# Patient Record
Sex: Male | Born: 1945
Health system: Southern US, Community
[De-identification: ages and names within clinical notes are randomized; demographics above are authoritative.]

## PROBLEM LIST (undated history)

## (undated) DIAGNOSIS — N289 Disorder of kidney and ureter, unspecified: Secondary | ICD-10-CM

## (undated) DIAGNOSIS — C801 Malignant (primary) neoplasm, unspecified: Secondary | ICD-10-CM

## (undated) DIAGNOSIS — E785 Hyperlipidemia, unspecified: Secondary | ICD-10-CM

## (undated) DIAGNOSIS — R7401 Elevation of levels of liver transaminase levels: Secondary | ICD-10-CM

## (undated) DIAGNOSIS — M109 Gout, unspecified: Secondary | ICD-10-CM

## (undated) DIAGNOSIS — R740 Nonspecific elevation of levels of transaminase and lactic acid dehydrogenase [LDH]: Secondary | ICD-10-CM

## (undated) DIAGNOSIS — I10 Essential (primary) hypertension: Secondary | ICD-10-CM

## (undated) HISTORY — PX: OTHER SURGICAL HISTORY: SHX169

## (undated) HISTORY — PX: APPENDECTOMY: SHX54

## (undated) HISTORY — PX: HAND SURGERY: SHX662

## (undated) HISTORY — PX: COLON RESECTION: SHX5231

## (undated) HISTORY — DX: Elevation of levels of liver transaminase levels: R74.01

## (undated) HISTORY — DX: Disorder of kidney and ureter, unspecified: N28.9

## (undated) HISTORY — DX: Nonspecific elevation of levels of transaminase and lactic acid dehydrogenase (ldh): R74.0

---

## 2001-09-24 ENCOUNTER — Emergency Department (HOSPITAL_COMMUNITY): Admission: EM | Admit: 2001-09-24 | Discharge: 2001-09-24 | Payer: Self-pay | Admitting: Emergency Medicine

## 2001-09-24 ENCOUNTER — Encounter: Payer: Self-pay | Admitting: Emergency Medicine

## 2003-12-06 ENCOUNTER — Ambulatory Visit (HOSPITAL_COMMUNITY): Admission: RE | Admit: 2003-12-06 | Discharge: 2003-12-06 | Payer: Self-pay | Admitting: Family Medicine

## 2004-09-04 ENCOUNTER — Ambulatory Visit (HOSPITAL_COMMUNITY): Admission: RE | Admit: 2004-09-04 | Discharge: 2004-09-04 | Payer: Self-pay | Admitting: Family Medicine

## 2004-11-09 ENCOUNTER — Emergency Department (HOSPITAL_COMMUNITY): Admission: EM | Admit: 2004-11-09 | Discharge: 2004-11-09 | Payer: Self-pay | Admitting: Emergency Medicine

## 2005-07-16 ENCOUNTER — Encounter (INDEPENDENT_AMBULATORY_CARE_PROVIDER_SITE_OTHER): Payer: Self-pay | Admitting: General Surgery

## 2005-07-16 ENCOUNTER — Ambulatory Visit (HOSPITAL_COMMUNITY): Admission: RE | Admit: 2005-07-16 | Discharge: 2005-07-16 | Payer: Self-pay | Admitting: General Surgery

## 2005-07-23 ENCOUNTER — Inpatient Hospital Stay (HOSPITAL_COMMUNITY): Admission: RE | Admit: 2005-07-23 | Discharge: 2005-07-27 | Payer: Self-pay | Admitting: General Surgery

## 2005-07-23 ENCOUNTER — Encounter (INDEPENDENT_AMBULATORY_CARE_PROVIDER_SITE_OTHER): Payer: Self-pay | Admitting: General Surgery

## 2010-06-20 ENCOUNTER — Ambulatory Visit (HOSPITAL_COMMUNITY): Admission: RE | Admit: 2010-06-20 | Discharge: 2010-06-20 | Payer: Self-pay | Admitting: Internal Medicine

## 2010-09-20 ENCOUNTER — Ambulatory Visit: Payer: Self-pay | Admitting: Cardiovascular Disease

## 2010-10-23 ENCOUNTER — Encounter (INDEPENDENT_AMBULATORY_CARE_PROVIDER_SITE_OTHER): Payer: Self-pay | Admitting: *Deleted

## 2010-10-24 ENCOUNTER — Encounter: Payer: Self-pay | Admitting: Cardiology

## 2010-11-09 NOTE — Letter (Signed)
Summary: [facility] MEDICAL RECORDS  [facility]   Imported By: Faythe Ghee 10/24/2010 09:36:22  _____________________________________________________________________  External Attachment:    Type:   Image     Comment:   External Document

## 2010-11-09 NOTE — Letter (Signed)
Summary: Appointment - Missed  [facility] [place]  [address], [place] [address]   Phone: [phone]  Fax: [phone]     October 23, 2010 MRN: [id number]   Jimmy Ayala [address] [place], [place]  [address]   Dear [name],  Our records indicate you missed your appointment on      10/23/10                  with Dr.       .      MCDOWELL                              It is very important that we reach you to reschedule this appointment. We look forward to participating in your health care needs. Please contact us at the number listed above at your earliest convenience to reschedule this appointment.     Sincerely,    Glass blower/designer

## 2011-02-23 NOTE — H&P (Signed)
NAME:  Jimmy Ayala, Jimmy Ayala NO.:  [id number]   MEDICAL RECORD NO.:  [id number]          PATIENT TYPE:  AMB   LOCATION:  DAY                           FACILITY:  [facility]   PHYSICIAN:  Dalia Heading, M.D.  DATE OF BIRTH:  31-Jul-1946   DATE OF ADMISSION:  07/18/2005  DATE OF DISCHARGE:  LH                                HISTORY & PHYSICAL   CHIEF COMPLAINT:  Sigmoid colon mass.   HISTORY OF PRESENT ILLNESS:  Patient is a 65 year old white male who  underwent a colonoscopy and was found to have a sessile mass in the sigmoid  colon.  It was approximately 35-cm from the anus.  Biopsies revealed a  villus adenoma, though there was an abnormal invasive component to this.  He  has a strong family history of colon carcinoma.  His father and many  relatives on his paternal side have a history of colon cancer.  The mass  could not be removed endoscopically.  The patient now comes to the operating  room for a partial colectomy.   PAST MEDICAL HISTORY:  Includes hypertension.   PAST SURGICAL HISTORY:  Unremarkable.   CURRENT MEDICATIONS:  1.  Vytorin 1 tablet p.o. daily  2.  Benazepril/hydrochlorothiazide 10/12.5 mg p.o. daily.   ALLERGIES:  No known drug allergies.   REVIEW OF SYSTEMS:  Patient denies drinking or smoking.  He denies any other  cardiopulmonary difficulties or bleeding disorders.   PHYSICAL EXAMINATION:  GENERAL:  Patient is a well-developed, well-nourished  white male in no acute distress.  LUNGS:  Clear to auscultation with equal breath sounds bilaterally.  HEART:  Examination reveals a regular rate and rhythm without S3, S4 or  murmurs.  ABDOMEN:  Soft, nontender and nondistended, no hepatosplenomegaly or masses  are noted.   IMPRESSION:  Sigmoid colon mass, possible malignant neoplasm.   PLAN:  The patient is scheduled to undergo a partial colectomy on July 23, 2005.  The risks and benefits of the procedure including bleeding,  infection and the  possibility of malignancy were fully explained to the  patient who gave informed consent.      Dalia Heading, M.D.  Electronically Signed     MAJ/MEDQ  D:  07/18/2005  T:  07/18/2005  Job:  [id number]   cc:   Corrie Mckusick, M.D.  Fax: [phone]   Short Stay  [facility]

## 2011-02-23 NOTE — H&P (Signed)
NAME:  Jimmy Ayala, SCHNOOR NO.:  [id number]   MEDICAL RECORD NO.:  [id number]          PATIENT TYPE:  AMB   LOCATION:                                FACILITY:  [facility]   PHYSICIAN:  Dalia Heading, M.D.  DATE OF BIRTH:  [birth year]   DATE OF ADMISSION:  DATE OF DISCHARGE:  LH                                HISTORY & PHYSICAL   CHIEF COMPLAINT:  Family history of colon carcinoma, need for screening  colonoscopy.   HISTORY OF PRESENT ILLNESS:  The patient is a 65 year old white male who is  referred for endoscopic evaluation. He needs a colonoscopy for screening due  to a family history of colon carcinoma. His father and many relatives on his  paternal side have a history of colon carcinoma. There is no abdominal pain,  weight loss, nausea, vomiting, diarrhea, constipation, melena, or  hematochezia. He has never had a colonoscopy.   PAST MEDICAL HISTORY:  Hypertension.   PAST SURGICAL HISTORY:  Unremarkable.   CURRENT MEDICATIONS:  Vytorin and benazepril/hydrochlorothiazide.   ALLERGIES:  No known drug allergies.   REVIEW OF SYSTEMS:  The patient does not smoke. He drinks alcohol  occasionally.   PHYSICAL EXAMINATION:  GENERAL:  The patient is a well-developed, well-  nourished, white male in no acute distress.  LUNGS:  Clear to auscultation with equal breath sounds bilaterally.  HEART:  Reveals regular rate and rhythm without S3, S4, or murmurs.  ABDOMEN:  Soft, nontender, nondistended. No hepatosplenomegaly or masses are  noted.  RECTAL:  Deferred to the procedure.   IMPRESSION:  Family history of colon carcinoma.   PLAN:  The patient is scheduled for colonoscopy on July 16, 2005. The  risks and benefits of the procedure including bleeding and perforation were  fully explained to the patient who gave informed consent.      Dalia Heading, M.D.  Electronically Signed     MAJ/MEDQ  D:  07/05/2005  T:  07/05/2005  Job:  [id number]   cc:   Corrie Mckusick, M.D.  Fax: [phone]   [facility]  Fax: [phone]

## 2011-02-23 NOTE — Op Note (Signed)
NAME:  Jimmy Ayala, Jimmy Ayala NO.:  [id number]   MEDICAL RECORD NO.:  [id number]          PATIENT TYPE:  AMB   LOCATION:  DAY                           FACILITY:  [facility]   PHYSICIAN:  Dalia Heading, M.D.  DATE OF BIRTH:  [birth year]   DATE OF PROCEDURE:  DATE OF DISCHARGE:                                 OPERATIVE REPORT   PREOPERATIVE DIAGNOSIS:  Sigmoid colon neoplasm.   POSTOPERATIVE DIAGNOSIS:  Sigmoid colon neoplasm.   PROCEDURE:  Partial colectomy with splenic flexure takedown.   SURGEON:  Dr. Franky Macho.   ASSISTANT:  Dr. Arna Snipe.   ANESTHESIA:  General endotracheal.   INDICATIONS:  The patient is a 65 year old white male who was found on  colonoscopy to have a sigmoid colon neoplasm. Biopsies initially were  negative for malignancy, though there was a desmoplastic reaction. The  patient has a strong family history of colon carcinoma. The patient now  comes the operating for partial colectomy. Risks and benefits of the  procedure including bleeding, infection, cardiopulmonary difficulties,  possibility of a blood transfusion were fully explained to the patient, who  gave informed consent.   PROCEDURE NOTE:  The patient is placed in supine position. After induction  of general endotracheal anesthesia, the abdomen was prepped and draped in  the usual sterile technique with Betadine. Surgical site confirmation was  performed.   A midline incision was made from the umbilicus to the suprapubic region. The  peritoneal cavity was entered into without difficulty. The liver and  gallbladder were inspected and noted to be normal limits. The small  intestine was noted be within normal limits. The nasogastric tube was noted  be in appropriate position in the stomach. The colon was inspected and the  patient had a mass in the mid sigmoid colon region. The descending colon,  sigmoid colon regions were mobilized along the line of Toldt. The splenic  flexure  was mobilized in order to obtain more descending colon. A GIA  stapler was placed across the proximal and distal portions of the sigmoid  colon. Care was taken to avoid the ureter. The sigmoid colon mesenteric tear  was then divided using the harmonic scalpel.  A suture was placed proximally  on the colon. The specimen was removed without difficulty. The specimen was  inspected in the operating room and adequate margins were noted proximally  and distally from the mass.  This was then sent to pathology for further  examination. A side-to-side anastomosis was then performed using a GIA  stapler. The staple line was closed using a TA60 stapler. The staple line  was bolstered using 3-0 silk Lembert sutures.  Surrounding adipose tissue  was then placed over the anastomosis. No tension was noted on the  anastomosis. The abdominal cavity was then copiously irrigated with normal  saline. All surgical personnel then changed their gloves.   The bowel was returned into the abdominal cavity in orderly fashion. The  fascia was reapproximated using a looped 0 Novofil running suture.  Subcutaneous layer was irrigated with normal saline and skin was closed  using staples. Betadine ointment  dry sterile dressings were applied.   All tape and needle counts correct the end of procedure. The patient was  extubated in the operating room and went back to recovery room awake in  stable condition.   COMPLICATIONS:  None.   SPECIMEN:  Sigmoid colon.   BLOOD LOSS:  Minimal.      Dalia Heading, M.D.  Electronically Signed     MAJ/MEDQ  D:  07/23/2005  T:  07/23/2005  Job:  [id number]   cc:   Corrie Mckusick, M.D.  Fax: [phone]

## 2011-02-23 NOTE — Discharge Summary (Signed)
NAME:  Jimmy Ayala, Jimmy Ayala NO.:  [id number]   MEDICAL RECORD NO.:  [id number]          PATIENT TYPE:  INP   LOCATION:  A331                          FACILITY:  [facility]   PHYSICIAN:  Dalia Heading, M.D.  DATE OF BIRTH:  [birth year]   DATE OF ADMISSION:  07/23/2005  DATE OF DISCHARGE:  10/20/2006LH                                 DISCHARGE SUMMARY   HOSPITAL COURSE:  The patient is a 65 year old, white male who was found on  colonoscopy to have a sigmoid colon mass.  Initial biopsies were suspicious  for malignancy, though no malignancy was seen.  The patient was brought to  the operating room on July 23, 2005, and underwent a partial colectomy.  He tolerated the procedure well.  His diet was advanced without difficulty.  His CEA level preoperatively was 1.  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  No lymph  nodes were identified at the time and the final pathology results are  pending.   CONDITION ON DISCHARGE:  The patient is being discharged home on postop day  #4 in good and improving condition.   FOLLOW UP:  The patient is to follow up with Dr. Franky Macho on July 31, 2005.   DISCHARGE MEDICATIONS:  1.  Vicodin 1-2 tablets p.o. q.4h. p.r.n. pain.  2.  He is to resume all his other medications as previously prescribed.   DISCHARGE DIAGNOSES:  1.  Sigmoid colon carcinoma.  2.  History of gout.  3.  Hypertension.   PRINCIPAL PROCEDURE:  Sigmoid colectomy on July 23, 2005.      Dalia Heading, M.D.  Electronically Signed     MAJ/MEDQ  D:  07/27/2005  T:  07/27/2005  Job:  [id number]   cc:   Corrie Mckusick, M.D.  Fax: [phone]

## 2012-05-22 NOTE — H&P (Signed)
  NTS SOAP Note  Vital Signs:  Vitals as of: 05/22/2012: Systolic 154: Diastolic 94: Heart Rate 93: Temp 96.59F: Height 20ft 8in: Weight 202Lbs 0 Ounces: BMI 31  BMI : 30.71 kg/m2  Subjective: This 3 Years 72 Months old Male presents for follow TCS.  Had partial colectomy for colon cancer 2006.  Has not had a follow TCS.  Denies any GI complaints.   Review of Symptoms:  Constitutional:unremarkable   Head:unremarkable    Eyes:unremarkable   Nose/Mouth/Throat:unremarkable Cardiovascular:  unremarkable   Respiratory:unremarkable   Gastrointestinal:  unremarkable   Genitourinary:unremarkable       joint, back, neck pain Skin:unremarkable Hematolgic/Lymphatic:unremarkable     Allergic/Immunologic:unremarkable     Past Medical History:    Reviewed   Past Medical History  Surgical History: sigmoid colectomy in 2006 Medical Problems:  High Blood pressure, High cholesterol, gout Allergies: nkda Medications: lisinopril, atorvastatin, amlodipine, allopurinol   Social History:Reviewed  Social History  Preferred Language: English ([place]) Race:  White Ethnicity: Not Hispanic / Latino Age: 66 Years 6 Months Marital Status:  M Alcohol: 8oz dily Recreational drug(s):  No   Smoking Status: Never smoker reviewed on 05/22/2012  Family History:  Reviewed   Family History  Is there a family history of:No family h/o colon cancer    Objective Information: General:  Well appearing, well nourished in no distress. Heart:  RRR, no murmur or gallop.  Normal S1, S2.  No S3, S4.  Lungs:    CTA bilaterally, no wheezes, rhonchi, rales.  Breathing unlabored. Abdomen:Soft, NT/ND, no HSM, no masses.   deferred to procedure  Assessment:Personal h/o colon cancer  Diagnosis &amp; Procedure: DiagnosisCode: V10.00, ProcedureCode: 04540,    Plan:Scheduled for TCS on 06/03/12.

## 2012-06-02 MED ORDER — SODIUM CHLORIDE 0.45 % IV SOLN
INTRAVENOUS | Status: DC
  Administered 2012-06-03: 09:00:00 via INTRAVENOUS

## 2012-06-02 MED ORDER — SODIUM CHLORIDE 0.9 % IV SOLN: INTRAVENOUS | Status: DC

## 2012-06-03 ENCOUNTER — Encounter (HOSPITAL_COMMUNITY)
Admission: RE | Disposition: A | Discharge status: Home / Self Care | Payer: Self-pay | Source: Ambulatory Visit | Attending: General Surgery

## 2012-06-03 ENCOUNTER — Encounter (HOSPITAL_COMMUNITY): Payer: Self-pay | Admitting: *Deleted

## 2012-06-03 ENCOUNTER — Ambulatory Visit (HOSPITAL_COMMUNITY)
Admission: RE | Admit: 2012-06-03 | Discharge: 2012-06-03 | Disposition: A | Discharge status: Home / Self Care | Payer: Medicare Other | Source: Ambulatory Visit | Attending: General Surgery | Admitting: General Surgery

## 2012-06-03 DIAGNOSIS — Z1211 Encounter for screening for malignant neoplasm of colon: Secondary | ICD-10-CM | POA: Insufficient documentation

## 2012-06-03 DIAGNOSIS — E7800 Pure hypercholesterolemia, unspecified: Secondary | ICD-10-CM | POA: Insufficient documentation

## 2012-06-03 DIAGNOSIS — K5730 Diverticulosis of large intestine without perforation or abscess without bleeding: Secondary | ICD-10-CM | POA: Insufficient documentation

## 2012-06-03 DIAGNOSIS — I10 Essential (primary) hypertension: Secondary | ICD-10-CM | POA: Insufficient documentation

## 2012-06-03 HISTORY — DX: Malignant (primary) neoplasm, unspecified: C80.1

## 2012-06-03 HISTORY — DX: Essential (primary) hypertension: I10

## 2012-06-03 HISTORY — DX: Gout, unspecified: M10.9

## 2012-06-03 HISTORY — DX: Hyperlipidemia, unspecified: E78.5

## 2012-06-03 HISTORY — PX: COLONOSCOPY: SHX5424

## 2012-06-03 SURGERY — COLONOSCOPY
Anesthesia: Moderate Sedation

## 2012-06-03 MED ORDER — MEPERIDINE HCL 25 MG/ML IJ SOLN
INTRAMUSCULAR | Status: DC | PRN
  Administered 2012-06-03: 50 mg via INTRAVENOUS

## 2012-06-03 MED ORDER — MIDAZOLAM HCL 5 MG/5ML IJ SOLN
INTRAMUSCULAR | Status: AC
  Filled 2012-06-03: qty 5

## 2012-06-03 MED ORDER — MIDAZOLAM HCL 5 MG/5ML IJ SOLN
INTRAMUSCULAR | Status: DC | PRN
  Administered 2012-06-03: 4 mg via INTRAVENOUS

## 2012-06-03 MED ORDER — MEPERIDINE HCL 50 MG/ML IJ SOLN
INTRAMUSCULAR | Status: AC
  Filled 2012-06-03: qty 1

## 2012-06-03 MED ORDER — STERILE WATER FOR IRRIGATION IR SOLN
Status: DC | PRN
  Administered 2012-06-03: 09:00:00

## 2012-06-03 NOTE — Interval H&P Note (Signed)
History and Physical Interval Note:  06/03/2012 9:12 AM  Jimmy Ayala  has presented today for surgery, with the diagnosis of History of colon cancer  The various methods of treatment have been discussed with the patient and family. After consideration of risks, benefits and other options for treatment, the patient has consented to  Procedure(s) (LRB): COLONOSCOPY (N/A) as a surgical intervention .  The patient's history has been reviewed, patient examined, no change in status, stable for surgery.  I have reviewed the patient's chart and labs.  Questions were answered to the patient's satisfaction.     [name]

## 2012-06-03 NOTE — Op Note (Signed)
[facility] [address] [place], [address]   COLONOSCOPY PROCEDURE REPORT  PATIENT: Jimmy Ayala, Jimmy Ayala  MR#: [id number] BIRTHDATE: 1946/09/01 , 66  yrs. old GENDER: Male ENDOSCOPIST: Franky Macho, MD REFERRED WN:[id number], William PROCEDURE DATE:  06/03/2012 PROCEDURE:   Colonoscopy, surveillance ASA CLASS:   Class II INDICATIONS:follow up for previously diagnosed colon cancer. MEDICATIONS: Versed 4 mg IV and Demerol 50 mg IV  DESCRIPTION OF PROCEDURE:   After the risks benefits and alternatives of the procedure were thoroughly explained, informed consent was obtained.  A digital rectal exam revealed no abnormalities of the rectum.   The Pentax Colonoscope [id number] endoscope was introduced through the anus and advanced to the cecum, which was identified by both the appendix and ileocecal valve. No adverse events experienced.   The quality of the prep was adequate, using Trilyte  The instrument was then slowly withdrawn as the colon was fully examined.      COLON FINDINGS: Moderate diverticulosis was noted in the descending colon.   There was no evidence of colon cancer.  Anastomosis on left side widely patent.  Retroflexed views revealed no abnormalities. The time to cecum=5 minutes 0 seconds  Withdrawal time=3 minutes 0 seconds.  The scope was withdrawn and the procedure completed. COMPLICATIONS: There were no complications. ENDOSCOPIC IMPRESSION: 1.   Moderate diverticulosis was noted in the descending colon 2.   There was no evidence of colon cancer.  Anastomosis on left side widely patent  RECOMMENDATIONS: repeat Colonoscopy in 5 years.  eSigned:  Franky Macho, MD 06/03/2012 9:37 AM   cc:

## 2012-06-05 ENCOUNTER — Encounter (HOSPITAL_COMMUNITY): Payer: Self-pay | Admitting: General Surgery

## 2012-12-22 ENCOUNTER — Ambulatory Visit (HOSPITAL_COMMUNITY)
Admission: RE | Admit: 2012-12-22 | Discharge: 2012-12-22 | Disposition: A | Discharge status: Home / Self Care | Payer: Medicare Other | Source: Ambulatory Visit | Attending: Family Medicine | Admitting: Family Medicine

## 2012-12-22 ENCOUNTER — Other Ambulatory Visit (HOSPITAL_COMMUNITY): Payer: Self-pay | Admitting: Family Medicine

## 2012-12-22 DIAGNOSIS — M542 Cervicalgia: Secondary | ICD-10-CM | POA: Insufficient documentation

## 2012-12-22 DIAGNOSIS — M5030 Other cervical disc degeneration, unspecified cervical region: Secondary | ICD-10-CM | POA: Insufficient documentation

## 2012-12-29 ENCOUNTER — Emergency Department (HOSPITAL_COMMUNITY): Payer: Medicare Other

## 2012-12-29 ENCOUNTER — Encounter (HOSPITAL_COMMUNITY): Payer: Self-pay | Admitting: *Deleted

## 2012-12-29 ENCOUNTER — Emergency Department (HOSPITAL_COMMUNITY)
Admission: EM | Admit: 2012-12-29 | Discharge: 2012-12-29 | Disposition: A | Discharge status: Home / Self Care | Payer: Medicare Other | Attending: Emergency Medicine | Admitting: Emergency Medicine

## 2012-12-29 DIAGNOSIS — R55 Syncope and collapse: Secondary | ICD-10-CM | POA: Insufficient documentation

## 2012-12-29 DIAGNOSIS — I10 Essential (primary) hypertension: Secondary | ICD-10-CM | POA: Insufficient documentation

## 2012-12-29 DIAGNOSIS — R42 Dizziness and giddiness: Secondary | ICD-10-CM | POA: Insufficient documentation

## 2012-12-29 DIAGNOSIS — R259 Unspecified abnormal involuntary movements: Secondary | ICD-10-CM | POA: Insufficient documentation

## 2012-12-29 DIAGNOSIS — Z79899 Other long term (current) drug therapy: Secondary | ICD-10-CM | POA: Insufficient documentation

## 2012-12-29 DIAGNOSIS — M109 Gout, unspecified: Secondary | ICD-10-CM | POA: Insufficient documentation

## 2012-12-29 DIAGNOSIS — IMO0002 Reserved for concepts with insufficient information to code with codable children: Secondary | ICD-10-CM | POA: Insufficient documentation

## 2012-12-29 DIAGNOSIS — E785 Hyperlipidemia, unspecified: Secondary | ICD-10-CM | POA: Insufficient documentation

## 2012-12-29 DIAGNOSIS — Z85038 Personal history of other malignant neoplasm of large intestine: Secondary | ICD-10-CM | POA: Insufficient documentation

## 2012-12-29 DIAGNOSIS — Y9301 Activity, walking, marching and hiking: Secondary | ICD-10-CM | POA: Insufficient documentation

## 2012-12-29 DIAGNOSIS — S46912A Strain of unspecified muscle, fascia and tendon at shoulder and upper arm level, left arm, initial encounter: Secondary | ICD-10-CM

## 2012-12-29 DIAGNOSIS — Y9289 Other specified places as the place of occurrence of the external cause: Secondary | ICD-10-CM | POA: Insufficient documentation

## 2012-12-29 DIAGNOSIS — W010XXA Fall on same level from slipping, tripping and stumbling without subsequent striking against object, initial encounter: Secondary | ICD-10-CM | POA: Insufficient documentation

## 2012-12-29 DIAGNOSIS — R5381 Other malaise: Secondary | ICD-10-CM | POA: Insufficient documentation

## 2012-12-29 LAB — BASIC METABOLIC PANEL
BUN: 22 mg/dL (ref 6–23)
Calcium: 9.6 mg/dL (ref 8.4–10.5)
GFR calc non Af Amer: >90 mL/min (ref >90)
Glucose, Bld: 141 mg/dL — ABNORMAL HIGH (ref 70–99)
Sodium: 135 mEq/L (ref 135–145)

## 2012-12-29 LAB — CBC WITH DIFFERENTIAL/PLATELET
Eosinophils Absolute: 0.1 10*3/uL (ref 0.0–0.7)
Eosinophils Relative: 1 % (ref 0–5)
Lymphs Abs: 3.3 10*3/uL (ref 0.7–4.0)
MCH: 32.8 pg (ref 26.0–34.0)
MCV: 94 fL (ref 78.0–100.0)
Monocytes Relative: 8 % (ref 3–12)
Platelets: 213 10*3/uL (ref 150–400)
RBC: 4.03 MIL/uL — ABNORMAL LOW (ref 4.22–5.81)

## 2012-12-29 MED ORDER — HYDROCODONE-ACETAMINOPHEN 5-325 MG PO TABS: 2.0000 | ORAL_TABLET | ORAL | Status: DC | PRN

## 2012-12-29 NOTE — ED Notes (Addendum)
Fall at work today while walking down a hill.  States his "feet came out from underneath him."  Denies hitting head/loc.  C/o pain to left upper arm.   No obvious deformity noted in triage.

## 2012-12-29 NOTE — ED Provider Notes (Signed)
History     CSN: [id number]  Arrival date & time 12/29/12  1019   First MD Initiated Contact with Patient 12/29/12 1100      Chief Complaint  Patient presents with  . Fall    (Consider location/radiation/quality/duration/timing/severity/associated sxs/prior treatment) HPI Comments: Patient came to the ER this morning after a fall. Patient reports that he was walking down a hill which was wet and he slipped, falling onto his left side. He is complaining of left upper arm pain at arrival to the ER. He did not hit his head and there was no loss of consciousness. Patient denies neck and back pain.  During the triage process, the patient became weak, dizzy and then had a syncopal episode. Triage nurse reports that he briefly became completely unresponsive and started shaking all over. She was unable to get a blood pressure at this time. This lasted only a few seconds, patient was immediately brought back to a room on the stretcher. By the time he reached the ER room, patient is awake and alert. Vital signs are now normal. Patient continues to complain of moderate to severe pain in his left upper arm just below the shoulder. It worsens with movement.  Patient is a 67 y.o. male presenting with fall.  Fall Pertinent negatives include no headaches.    Past Medical History  Diagnosis Date  . Cancer     Colon Cancer  . Hypertension   . Hyperlipidemia   . Gout     Past Surgical History  Procedure Laterality Date  . Colon resection    . Right rotator cuff repair    . Appendectomy    . Colonoscopy  06/03/2012    Procedure: COLONOSCOPY;  Surgeon: Dalia Heading, MD;  Location: [facility];  Service: Gastroenterology;  Laterality: N/A;    Family History  Problem Relation Age of Onset  . Colon cancer Father     History  Substance Use Topics  . Smoking status: Never Smoker   . Smokeless tobacco: Not on file  . Alcohol Use: Yes     Comment: 1 pint/week      Review of Systems   Respiratory: Negative for shortness of breath.   Cardiovascular: Negative for chest pain.  Musculoskeletal:       Left arm pain  Neurological: Negative for dizziness and headaches.  All other systems reviewed and are negative.    Allergies  Lipitor and Prednisone  Home Medications   Current Outpatient Rx  Name  Route  Sig  Dispense  Refill  . allopurinol (ZYLOPRIM) 100 MG tablet   Oral   Take 100 mg by mouth daily.         Marland Kitchen aspirin 325 MG tablet   Oral   Take 325 mg by mouth daily.         Marland Kitchen atorvastatin (LIPITOR) 20 MG tablet   Oral   Take 20 mg by mouth daily.         Marland Kitchen lisinopril (PRINIVIL,ZESTRIL) 20 MG tablet   Oral   Take 20 mg by mouth daily.           There were no vitals taken for this visit.  Physical Exam  Constitutional: He is oriented to person, place, and time. He appears well-developed and well-nourished. No distress.  HENT:  Head: Normocephalic and atraumatic.  Right Ear: Hearing normal.  Nose: Nose normal.  Mouth/Throat: Oropharynx is clear and moist and mucous membranes are normal.  Eyes: Conjunctivae and EOM  are normal. Pupils are equal, round, and reactive to light.  Neck: Normal range of motion. Neck supple.  Cardiovascular: Normal rate, regular rhythm, S1 normal and S2 normal.  Exam reveals no gallop and no friction rub.   No murmur heard. Pulmonary/Chest: Effort normal and breath sounds normal. No respiratory distress. He exhibits no tenderness.  Abdominal: Soft. Normal appearance and bowel sounds are normal. There is no hepatosplenomegaly. There is no tenderness. There is no rebound, no guarding, no tenderness at McBurney's point and negative Murphy's sign. No hernia.  Musculoskeletal: Normal range of motion.       Left upper arm: He exhibits tenderness and deformity.  Neurological: He is alert and oriented to person, place, and time. He has normal strength. No cranial nerve deficit or sensory deficit. Coordination normal. GCS eye  subscore is 4. GCS verbal subscore is 5. GCS motor subscore is 6.  Skin: Skin is warm, dry and intact. No rash noted. No cyanosis.  Psychiatric: He has a normal mood and affect. His speech is normal and behavior is normal. Thought content normal.    ED Course  Procedures (including critical care time)   Date: 12/29/2012  Rate: 80  Rhythm: normal sinus rhythm  QRS Axis: normal  Intervals: normal  ST/T Wave abnormalities: normal  Conduction Disutrbances:inc RBBB  Narrative Interpretation:   Old EKG Reviewed: none available    Labs Reviewed  GLUCOSE, CAPILLARY - Abnormal; Notable for the following:    Glucose-Capillary 147 (*)    All other components within normal limits  CBC WITH DIFFERENTIAL - Abnormal; Notable for the following:    WBC 10.9 (*)    RBC 4.03 (*)    HCT 37.9 (*)    All other components within normal limits  BASIC METABOLIC PANEL - Abnormal; Notable for the following:    Glucose, Bld 141 (*)    All other components within normal limits   Ct Head Wo Contrast  12/29/2012  *RADIOLOGY REPORT*  Clinical Data: Fall today following syncopal episode.  Headache with left arm pain.  CT HEAD WITHOUT CONTRAST  Technique:  Contiguous axial images were obtained from the base of the skull through the vertex without contrast.  Comparison: None.  Findings: There is no evidence of acute intracranial hemorrhage, mass lesion, brain edema or extra-axial fluid collection.  The ventricles and subarachnoid spaces are appropriately sized for age. There is no CT evidence of acute cortical infarction.  There is a probable mucous retention cyst within the right maxillary sinus. There is mild ethmoid and sphenoid sinus mucosal thickening. The calvarium is intact.  IMPRESSION: No acute intracranial or calvarial findings.   Original Report Authenticated By: Carey Bullocks, M.D.    Dg Humerus Left  12/29/2012  *RADIOLOGY REPORT*  Clinical Data: Fall.  Humerus pain.  LEFT HUMERUS - 2+ VIEW   Comparison: None.  Findings: Two-view exam of the left humerus is under penetrated at the level of the shoulder.  Within this limitation, no fracture is evident.  No worrisome lytic or sclerotic osseous abnormality.  IMPRESSION: No evidence for left humerus fracture.   Original Report Authenticated By: Kennith Center, M.D.      Diagnosis: 1. Left shoulder injury, possible rotator cuff tear 2. Vasovagal syncope    MDM  Patient slipped while walking and fell to the ground, injuring his left shoulder. Patient was having severe pain in the proximal humerus and anterior shoulder area. Upon arrival to the ER he was placed in triage and had what seems  to have been a vasovagal episode. Patient briefly lost consciousness and the triage nurse was unable to get her blood pressure at that time. By the time he was placed on a stretcher and brought back to the room, he was awake and alert and oriented with normal vital signs. There is nothing to indicate seizure. I did perform a CT scan of his head to rule out intracranial injury from the fall causing the symptoms. Was normal. His neurologic exam is completely normal.  An x-ray of the humerus does not show any fracture. Patient is unable to abduct his left shoulder. This raises concern for possible rotator cuff tear as a cause of the patient's pain, resulting from the fall. He was placed in a sling for comfort and provided analgesia. He will followup with orthopedics.        Gilda Crease, MD 12/29/12 1300

## 2012-12-29 NOTE — ED Notes (Signed)
During triage, pt became very pale, diaphoretic, states " I feel like I'm going to be sick."  Pt's eyes rolled in back of his eyes and became very lethargic.  Syncopal episode then turned into seizure like activity with snoring respirations and unresponsiveness.  Lasting approx 30-45 secs.  Pt alert and oriented at this time.  edp at bedside.

## 2014-03-31 DIAGNOSIS — N289 Disorder of kidney and ureter, unspecified: Secondary | ICD-10-CM

## 2014-03-31 HISTORY — DX: Disorder of kidney and ureter, unspecified: N28.9

## 2014-04-13 ENCOUNTER — Ambulatory Visit: Payer: Medicare Other | Admitting: Neurology

## 2014-04-16 ENCOUNTER — Ambulatory Visit (INDEPENDENT_AMBULATORY_CARE_PROVIDER_SITE_OTHER): Payer: Medicare Other | Admitting: Neurology

## 2014-04-16 ENCOUNTER — Encounter: Payer: Self-pay | Admitting: Neurology

## 2014-04-16 VITALS — BP 140/80 | HR 104 | Resp 18 | Ht 68.0 in | Wt 193.0 lb

## 2014-04-16 DIAGNOSIS — F10988 Alcohol use, unspecified with other alcohol-induced disorder: Secondary | ICD-10-CM

## 2014-04-16 DIAGNOSIS — F1020 Alcohol dependence, uncomplicated: Secondary | ICD-10-CM

## 2014-04-16 DIAGNOSIS — R7309 Other abnormal glucose: Secondary | ICD-10-CM

## 2014-04-16 DIAGNOSIS — R259 Unspecified abnormal involuntary movements: Secondary | ICD-10-CM

## 2014-04-16 DIAGNOSIS — R739 Hyperglycemia, unspecified: Secondary | ICD-10-CM

## 2014-04-16 DIAGNOSIS — F1029 Alcohol dependence with unspecified alcohol-induced disorder: Secondary | ICD-10-CM

## 2014-04-16 DIAGNOSIS — R251 Tremor, unspecified: Secondary | ICD-10-CM

## 2014-04-16 DIAGNOSIS — R740 Nonspecific elevation of levels of transaminase and lactic acid dehydrogenase [LDH]: Secondary | ICD-10-CM

## 2014-04-16 DIAGNOSIS — R7401 Elevation of levels of liver transaminase levels: Secondary | ICD-10-CM

## 2014-04-16 NOTE — Patient Instructions (Signed)
1. Your provider has requested that you have labwork completed today. Please go to [facility] on the first floor of this building before leaving the office today.  

## 2014-04-16 NOTE — Progress Notes (Signed)
Subjective:    Jimmy Ayala was seen in consultation in the movement disorder clinic at the request of Dr. Hilma Favors.  The evaluation is for tremor.  The patient is a 68 y.o. right handed male with a history of tremor.  Tremor has been going on for 6 months.  It started gradually.  Tremor is worse in the AM and he can hardly feed himself in the AM.  He states that he drinks 3 beers throughout the day, the first beginning at 12:30 pm and that is what will begin to calm the tremor so that he can function during the day.  In addition to the beers, he may drink 1-2 shots of bourbon a few days per week.  He has drank EtOH for 35+ years but doesn't feel that he is dependent on it.    There is no family hx of tremor.    Affected by caffeine:  Doesn't drink any Affected by alcohol:  Yes.  , marked improvement Affected by stress:  No. Affected by fatigue:  No. Spills soup if on spoon:  Yes.   Spills glass of liquid if full:  Yes.  , if in the AM Affects ADL's (tying shoes, brushing teeth, etc):  Yes.  , cannot shave until the afternoon as he uses a straight razor.  Current/Previously tried tremor medications: n/a  Current medications that may exacerbate tremor:  n/a  Outside reports reviewed: ER records, lab reports and referral letter/letters.  Allergies  Allergen Reactions  . Lipitor [Atorvastatin]     Makes patient hurt all over.  . Prednisone Swelling    Current Outpatient Prescriptions on File Prior to Visit  Medication Sig Dispense Refill  . lisinopril (PRINIVIL,ZESTRIL) 20 MG tablet Take 20 mg by mouth daily.      . simvastatin (ZOCOR) 40 MG tablet Take 40 mg by mouth every evening.       No current facility-administered medications on file prior to visit.    Past Medical History  Diagnosis Date  . Cancer     Colon Cancer  . Hypertension   . Hyperlipidemia   . Gout   . Renal insufficiency 03/31/14    1.43  . Transaminasemia     Past Surgical History  Procedure  Laterality Date  . Colon resection    . Right rotator cuff repair    . Appendectomy    . Colonoscopy  06/03/2012    Procedure: COLONOSCOPY;  Surgeon: Jamesetta So, MD;  Location: [facility];  Service: Gastroenterology;  Laterality: N/A;    History   Social History  . Marital Status: Married    Spouse Name: N/A    Number of Children: N/A  . Years of Education: N/A   Occupational History  . Not on file.   Social History Main Topics  . Smoking status: Never Smoker   . Smokeless tobacco: Not on file  . Alcohol Use: Yes     Comment: (approx 3 beers a day)  . Drug Use: No  . Sexual Activity: Not on file   Other Topics Concern  . Not on file   Social History Narrative  . No narrative on file    Family Status  Relation Status Death Age  . Mother Deceased     "old age"  . Father Deceased     colon cancer  . Brother Alive     healthy  . Sister Alive     healthy  . Son [employer]  healthy  . Son Alive     healthy  . Daughter Alive     healthy    Review of Systems A complete 10 system ROS was obtained and was negative apart from what is mentioned.   Objective:   VITALS:   Filed Vitals:   04/16/14 1341  BP: 140/80  Pulse: 104  Resp: 18  Height: 5\' 8"  (1.727 m)  Weight: 193 lb (87.544 kg)   Gen:  Appears stated age and in NAD. HEENT:  Normocephalic, atraumatic. The mucous membranes are moist. The superficial temporal arteries are without ropiness or tenderness. Cardiovascular: Regular rate and rhythm. Lungs: Clear to auscultation bilaterally. Neck: There are no carotid bruits noted bilaterally.  NEUROLOGICAL:  Orientation:  The patient is alert and oriented x 3.  Recent and remote memory are intact.  Attention span and concentration are normal.   Cranial nerves: There is good facial symmetry. The pupils are equal round and reactive to light bilaterally. Fundoscopic exam reveals clear disc margins bilaterally. Extraocular muscles are intact and visual  fields are full to confrontational testing. Speech is fluent and clear. Soft palate rises symmetrically and there is no tongue deviation. Hearing is intact to conversational tone. Tone: Tone is good throughout. Sensation: Sensation is intact to light touch and pinprick throughout (facial, trunk, extremities). Vibration is intact at the bilateral big toe. There is no extinction with double simultaneous stimulation. There is no sensory dermatomal level identified. Coordination:  The patient has no dysdiadichokinesia or dysmetria. Motor: Strength is 5/5 in the bilateral upper and lower extremities.  Shoulder shrug is equal bilaterally.  There is no pronator drift.  There are no fasciculations noted. DTR's: Deep tendon reflexes are 2+/4 at the bilateral biceps, triceps, brachioradialis, patella and achilles.  Plantar responses are downgoing bilaterally. Gait and Station: The patient is able to ambulate without difficulty. The patient has trouble ambulating in a tandem fashion.  He is able to stand in the Romberg position.  MOVEMENT EXAM: Tremor:  There is tremor in the UE, noted most significantly with action. However, there is a tremor that can be felt at rest as well bilaterally.  Tremor increases with intention.  The right hand is worse than the left somewhat.  He has difficulty with Archimedes spirals.   Labs: I reviewed labwork from his primary care physician.  His fasting glucose was 127.  His creatinine was 1.43.  AST was 80 and ALT was 44.  His CBC was normal.  TSH was normal at 1.791.  These labs were drawn on 03/30/2014.     Assessment/Plan:   1.  Tremor.  -I feel that this is likely due to alcohol.  The patient's tremor is predominantly in the morning, and gets better as he drinks alcohol.  There are very few other tremors that get better as the day progresses.  I had a very long discussion with the patient today.  I do think that he needs to taper his alcohol, and told him that tremor will  likely increase before it decreases.  I told him that I thought he needed to do this under the supervision of his primary care physician.  The patient felt confident that he could stop this cold [place], and did not feel that there is any addiction, but I would prefer that he be monitored by a physician as he tries to taper.  He expressed understanding.  -I. did talk to him about the fact that he did have mildly elevated liver enzymes  on his last lab work, which are also likely due to alcohol, and he also had some minor renal insufficiency.  He is going to be following up with his primary care physician.  I am going to go ahead and check his hemoglobin A1c, since he was fasting on his last lab work and his glucose was mildly elevated.  This could contribute to tremor as well, but this could also improve if he tapers the alcohol.  Much greater than 50% of the 45 minute visit was spent in counseling.  -f/u prn

## 2014-04-16 NOTE — Progress Notes (Signed)
Note faxed to Dr Hilma Favors.

## 2014-04-17 LAB — HEMOGLOBIN A1C
Hgb A1c MFr Bld: 6.1 % — ABNORMAL HIGH (ref <5.7)
MEAN PLASMA GLUCOSE: 128 mg/dL — AB (ref <117)

## 2014-04-19 NOTE — Progress Notes (Signed)
Lab results faxed to Dr Elsie Lincoln at [phone].

## 2014-12-30 ENCOUNTER — Other Ambulatory Visit (HOSPITAL_COMMUNITY): Payer: Self-pay | Admitting: Family Medicine

## 2014-12-30 DIAGNOSIS — R945 Abnormal results of liver function studies: Secondary | ICD-10-CM

## 2015-01-04 ENCOUNTER — Ambulatory Visit (HOSPITAL_COMMUNITY)
Admission: RE | Admit: 2015-01-04 | Discharge: 2015-01-04 | Disposition: A | Discharge status: Home / Self Care | Payer: Medicare Other | Source: Ambulatory Visit | Attending: Family Medicine | Admitting: Family Medicine

## 2015-01-04 ENCOUNTER — Other Ambulatory Visit (HOSPITAL_COMMUNITY): Payer: Self-pay | Admitting: Family Medicine

## 2015-01-04 DIAGNOSIS — R7989 Other specified abnormal findings of blood chemistry: Secondary | ICD-10-CM | POA: Insufficient documentation

## 2015-01-04 DIAGNOSIS — N281 Cyst of kidney, acquired: Secondary | ICD-10-CM | POA: Insufficient documentation

## 2015-01-04 DIAGNOSIS — R945 Abnormal results of liver function studies: Secondary | ICD-10-CM

## 2015-01-04 DIAGNOSIS — K760 Fatty (change of) liver, not elsewhere classified: Secondary | ICD-10-CM | POA: Insufficient documentation

## 2016-01-02 DIAGNOSIS — C20 Malignant neoplasm of rectum: Secondary | ICD-10-CM | POA: Diagnosis not present

## 2016-01-02 DIAGNOSIS — E782 Mixed hyperlipidemia: Secondary | ICD-10-CM | POA: Diagnosis not present

## 2016-01-02 DIAGNOSIS — R7309 Other abnormal glucose: Secondary | ICD-10-CM | POA: Diagnosis not present

## 2016-01-02 DIAGNOSIS — I10 Essential (primary) hypertension: Secondary | ICD-10-CM | POA: Diagnosis not present

## 2016-01-02 DIAGNOSIS — Z1389 Encounter for screening for other disorder: Secondary | ICD-10-CM | POA: Diagnosis not present

## 2016-01-19 DIAGNOSIS — D582 Other hemoglobinopathies: Secondary | ICD-10-CM | POA: Diagnosis not present

## 2016-01-19 DIAGNOSIS — Z1211 Encounter for screening for malignant neoplasm of colon: Secondary | ICD-10-CM | POA: Diagnosis not present

## 2016-04-19 DIAGNOSIS — Z1389 Encounter for screening for other disorder: Secondary | ICD-10-CM | POA: Diagnosis not present

## 2016-04-19 DIAGNOSIS — M7989 Other specified soft tissue disorders: Secondary | ICD-10-CM | POA: Diagnosis not present

## 2016-04-19 DIAGNOSIS — R600 Localized edema: Secondary | ICD-10-CM | POA: Diagnosis not present

## 2016-06-12 DIAGNOSIS — H2513 Age-related nuclear cataract, bilateral: Secondary | ICD-10-CM | POA: Diagnosis not present

## 2016-08-24 DIAGNOSIS — R81 Glycosuria: Secondary | ICD-10-CM | POA: Diagnosis not present

## 2016-08-24 DIAGNOSIS — I10 Essential (primary) hypertension: Secondary | ICD-10-CM | POA: Diagnosis not present

## 2016-08-24 DIAGNOSIS — R7309 Other abnormal glucose: Secondary | ICD-10-CM | POA: Diagnosis not present

## 2016-08-24 DIAGNOSIS — Z1389 Encounter for screening for other disorder: Secondary | ICD-10-CM | POA: Diagnosis not present

## 2016-08-24 DIAGNOSIS — E782 Mixed hyperlipidemia: Secondary | ICD-10-CM | POA: Diagnosis not present

## 2016-08-24 DIAGNOSIS — Z79899 Other long term (current) drug therapy: Secondary | ICD-10-CM | POA: Diagnosis not present

## 2016-09-12 DIAGNOSIS — E782 Mixed hyperlipidemia: Secondary | ICD-10-CM | POA: Diagnosis not present

## 2016-09-12 DIAGNOSIS — E1165 Type 2 diabetes mellitus with hyperglycemia: Secondary | ICD-10-CM | POA: Diagnosis not present

## 2016-12-14 DIAGNOSIS — Z0000 Encounter for general adult medical examination without abnormal findings: Secondary | ICD-10-CM | POA: Diagnosis not present

## 2016-12-14 DIAGNOSIS — E1165 Type 2 diabetes mellitus with hyperglycemia: Secondary | ICD-10-CM | POA: Diagnosis not present

## 2016-12-14 DIAGNOSIS — I10 Essential (primary) hypertension: Secondary | ICD-10-CM | POA: Diagnosis not present

## 2016-12-14 DIAGNOSIS — E782 Mixed hyperlipidemia: Secondary | ICD-10-CM | POA: Diagnosis not present

## 2016-12-31 DIAGNOSIS — Z1389 Encounter for screening for other disorder: Secondary | ICD-10-CM | POA: Diagnosis not present

## 2016-12-31 DIAGNOSIS — Z0000 Encounter for general adult medical examination without abnormal findings: Secondary | ICD-10-CM | POA: Diagnosis not present

## 2017-11-11 DIAGNOSIS — E119 Type 2 diabetes mellitus without complications: Secondary | ICD-10-CM | POA: Diagnosis not present

## 2017-11-11 DIAGNOSIS — Z1389 Encounter for screening for other disorder: Secondary | ICD-10-CM | POA: Diagnosis not present

## 2017-11-11 DIAGNOSIS — I10 Essential (primary) hypertension: Secondary | ICD-10-CM | POA: Diagnosis not present

## 2017-11-11 DIAGNOSIS — E782 Mixed hyperlipidemia: Secondary | ICD-10-CM | POA: Diagnosis not present

## 2017-12-24 DIAGNOSIS — R7309 Other abnormal glucose: Secondary | ICD-10-CM | POA: Diagnosis not present

## 2017-12-24 DIAGNOSIS — Z0000 Encounter for general adult medical examination without abnormal findings: Secondary | ICD-10-CM | POA: Diagnosis not present

## 2017-12-24 DIAGNOSIS — E1165 Type 2 diabetes mellitus with hyperglycemia: Secondary | ICD-10-CM | POA: Diagnosis not present

## 2017-12-24 DIAGNOSIS — Z1389 Encounter for screening for other disorder: Secondary | ICD-10-CM | POA: Diagnosis not present

## 2018-05-22 DIAGNOSIS — Z1389 Encounter for screening for other disorder: Secondary | ICD-10-CM | POA: Diagnosis not present

## 2018-05-22 DIAGNOSIS — Z79899 Other long term (current) drug therapy: Secondary | ICD-10-CM | POA: Diagnosis not present

## 2018-05-22 DIAGNOSIS — K760 Fatty (change of) liver, not elsewhere classified: Secondary | ICD-10-CM | POA: Diagnosis not present

## 2018-05-22 DIAGNOSIS — E782 Mixed hyperlipidemia: Secondary | ICD-10-CM | POA: Diagnosis not present

## 2018-05-22 DIAGNOSIS — E119 Type 2 diabetes mellitus without complications: Secondary | ICD-10-CM | POA: Diagnosis not present

## 2018-05-22 DIAGNOSIS — R945 Abnormal results of liver function studies: Secondary | ICD-10-CM | POA: Diagnosis not present

## 2018-10-10 DIAGNOSIS — H524 Presbyopia: Secondary | ICD-10-CM | POA: Diagnosis not present

## 2018-10-10 DIAGNOSIS — H5203 Hypermetropia, bilateral: Secondary | ICD-10-CM | POA: Diagnosis not present

## 2018-11-19 DIAGNOSIS — K760 Fatty (change of) liver, not elsewhere classified: Secondary | ICD-10-CM | POA: Diagnosis not present

## 2018-11-19 DIAGNOSIS — R7309 Other abnormal glucose: Secondary | ICD-10-CM | POA: Diagnosis not present

## 2018-11-19 DIAGNOSIS — E1165 Type 2 diabetes mellitus with hyperglycemia: Secondary | ICD-10-CM | POA: Diagnosis not present

## 2018-11-19 DIAGNOSIS — Z0001 Encounter for general adult medical examination with abnormal findings: Secondary | ICD-10-CM | POA: Diagnosis not present

## 2018-11-19 DIAGNOSIS — Z1389 Encounter for screening for other disorder: Secondary | ICD-10-CM | POA: Diagnosis not present

## 2018-11-19 DIAGNOSIS — E785 Hyperlipidemia, unspecified: Secondary | ICD-10-CM | POA: Diagnosis not present

## 2018-11-19 DIAGNOSIS — I10 Essential (primary) hypertension: Secondary | ICD-10-CM | POA: Diagnosis not present

## 2018-11-21 DIAGNOSIS — E1165 Type 2 diabetes mellitus with hyperglycemia: Secondary | ICD-10-CM | POA: Diagnosis not present

## 2019-02-16 DIAGNOSIS — I10 Essential (primary) hypertension: Secondary | ICD-10-CM | POA: Diagnosis not present

## 2019-02-16 DIAGNOSIS — E119 Type 2 diabetes mellitus without complications: Secondary | ICD-10-CM | POA: Diagnosis not present

## 2019-02-16 DIAGNOSIS — E7849 Other hyperlipidemia: Secondary | ICD-10-CM | POA: Diagnosis not present

## 2019-02-16 DIAGNOSIS — E1165 Type 2 diabetes mellitus with hyperglycemia: Secondary | ICD-10-CM | POA: Diagnosis not present

## 2019-02-16 DIAGNOSIS — Z1389 Encounter for screening for other disorder: Secondary | ICD-10-CM | POA: Diagnosis not present

## 2019-02-16 DIAGNOSIS — E785 Hyperlipidemia, unspecified: Secondary | ICD-10-CM | POA: Diagnosis not present

## 2019-05-19 DIAGNOSIS — S83251A Bucket-handle tear of lateral meniscus, current injury, right knee, initial encounter: Secondary | ICD-10-CM | POA: Diagnosis not present

## 2019-09-07 DIAGNOSIS — E1165 Type 2 diabetes mellitus with hyperglycemia: Secondary | ICD-10-CM | POA: Diagnosis not present

## 2019-09-07 DIAGNOSIS — E119 Type 2 diabetes mellitus without complications: Secondary | ICD-10-CM | POA: Diagnosis not present

## 2019-09-07 DIAGNOSIS — I129 Hypertensive chronic kidney disease with stage 1 through stage 4 chronic kidney disease, or unspecified chronic kidney disease: Secondary | ICD-10-CM | POA: Diagnosis not present

## 2019-09-07 DIAGNOSIS — N182 Chronic kidney disease, stage 2 (mild): Secondary | ICD-10-CM | POA: Diagnosis not present

## 2019-09-07 DIAGNOSIS — E7849 Other hyperlipidemia: Secondary | ICD-10-CM | POA: Diagnosis not present

## 2019-09-07 DIAGNOSIS — E1122 Type 2 diabetes mellitus with diabetic chronic kidney disease: Secondary | ICD-10-CM | POA: Diagnosis not present

## 2019-10-08 DIAGNOSIS — E7849 Other hyperlipidemia: Secondary | ICD-10-CM | POA: Diagnosis not present

## 2019-10-08 DIAGNOSIS — I129 Hypertensive chronic kidney disease with stage 1 through stage 4 chronic kidney disease, or unspecified chronic kidney disease: Secondary | ICD-10-CM | POA: Diagnosis not present

## 2019-10-08 DIAGNOSIS — N182 Chronic kidney disease, stage 2 (mild): Secondary | ICD-10-CM | POA: Diagnosis not present

## 2019-10-08 DIAGNOSIS — E1165 Type 2 diabetes mellitus with hyperglycemia: Secondary | ICD-10-CM | POA: Diagnosis not present

## 2019-11-08 DIAGNOSIS — E7849 Other hyperlipidemia: Secondary | ICD-10-CM | POA: Diagnosis not present

## 2019-11-08 DIAGNOSIS — N182 Chronic kidney disease, stage 2 (mild): Secondary | ICD-10-CM | POA: Diagnosis not present

## 2019-11-08 DIAGNOSIS — E1122 Type 2 diabetes mellitus with diabetic chronic kidney disease: Secondary | ICD-10-CM | POA: Diagnosis not present

## 2019-11-08 DIAGNOSIS — I129 Hypertensive chronic kidney disease with stage 1 through stage 4 chronic kidney disease, or unspecified chronic kidney disease: Secondary | ICD-10-CM | POA: Diagnosis not present

## 2019-11-19 DIAGNOSIS — Z1389 Encounter for screening for other disorder: Secondary | ICD-10-CM | POA: Diagnosis not present

## 2019-11-19 DIAGNOSIS — E1165 Type 2 diabetes mellitus with hyperglycemia: Secondary | ICD-10-CM | POA: Diagnosis not present

## 2019-11-19 DIAGNOSIS — Z0001 Encounter for general adult medical examination with abnormal findings: Secondary | ICD-10-CM | POA: Diagnosis not present

## 2019-11-19 DIAGNOSIS — E785 Hyperlipidemia, unspecified: Secondary | ICD-10-CM | POA: Diagnosis not present

## 2019-11-19 DIAGNOSIS — I10 Essential (primary) hypertension: Secondary | ICD-10-CM | POA: Diagnosis not present

## 2019-11-23 DIAGNOSIS — Z1389 Encounter for screening for other disorder: Secondary | ICD-10-CM | POA: Diagnosis not present

## 2019-11-23 DIAGNOSIS — Z79899 Other long term (current) drug therapy: Secondary | ICD-10-CM | POA: Diagnosis not present

## 2019-11-23 DIAGNOSIS — E782 Mixed hyperlipidemia: Secondary | ICD-10-CM | POA: Diagnosis not present

## 2019-11-23 DIAGNOSIS — Z0001 Encounter for general adult medical examination with abnormal findings: Secondary | ICD-10-CM | POA: Diagnosis not present

## 2019-11-23 DIAGNOSIS — E785 Hyperlipidemia, unspecified: Secondary | ICD-10-CM | POA: Diagnosis not present

## 2019-12-06 DIAGNOSIS — I129 Hypertensive chronic kidney disease with stage 1 through stage 4 chronic kidney disease, or unspecified chronic kidney disease: Secondary | ICD-10-CM | POA: Diagnosis not present

## 2019-12-06 DIAGNOSIS — E7849 Other hyperlipidemia: Secondary | ICD-10-CM | POA: Diagnosis not present

## 2019-12-06 DIAGNOSIS — N182 Chronic kidney disease, stage 2 (mild): Secondary | ICD-10-CM | POA: Diagnosis not present

## 2019-12-06 DIAGNOSIS — E1122 Type 2 diabetes mellitus with diabetic chronic kidney disease: Secondary | ICD-10-CM | POA: Diagnosis not present

## 2020-01-06 DIAGNOSIS — E7849 Other hyperlipidemia: Secondary | ICD-10-CM | POA: Diagnosis not present

## 2020-01-06 DIAGNOSIS — I129 Hypertensive chronic kidney disease with stage 1 through stage 4 chronic kidney disease, or unspecified chronic kidney disease: Secondary | ICD-10-CM | POA: Diagnosis not present

## 2020-01-06 DIAGNOSIS — E1122 Type 2 diabetes mellitus with diabetic chronic kidney disease: Secondary | ICD-10-CM | POA: Diagnosis not present

## 2020-01-06 DIAGNOSIS — N182 Chronic kidney disease, stage 2 (mild): Secondary | ICD-10-CM | POA: Diagnosis not present

## 2020-02-05 DIAGNOSIS — N182 Chronic kidney disease, stage 2 (mild): Secondary | ICD-10-CM | POA: Diagnosis not present

## 2020-02-05 DIAGNOSIS — I129 Hypertensive chronic kidney disease with stage 1 through stage 4 chronic kidney disease, or unspecified chronic kidney disease: Secondary | ICD-10-CM | POA: Diagnosis not present

## 2020-02-05 DIAGNOSIS — E7849 Other hyperlipidemia: Secondary | ICD-10-CM | POA: Diagnosis not present

## 2020-02-05 DIAGNOSIS — E1122 Type 2 diabetes mellitus with diabetic chronic kidney disease: Secondary | ICD-10-CM | POA: Diagnosis not present

## 2020-03-07 DIAGNOSIS — N182 Chronic kidney disease, stage 2 (mild): Secondary | ICD-10-CM | POA: Diagnosis not present

## 2020-03-07 DIAGNOSIS — I129 Hypertensive chronic kidney disease with stage 1 through stage 4 chronic kidney disease, or unspecified chronic kidney disease: Secondary | ICD-10-CM | POA: Diagnosis not present

## 2020-03-07 DIAGNOSIS — E1122 Type 2 diabetes mellitus with diabetic chronic kidney disease: Secondary | ICD-10-CM | POA: Diagnosis not present

## 2020-03-07 DIAGNOSIS — E7849 Other hyperlipidemia: Secondary | ICD-10-CM | POA: Diagnosis not present

## 2020-04-06 DIAGNOSIS — E7849 Other hyperlipidemia: Secondary | ICD-10-CM | POA: Diagnosis not present

## 2020-04-06 DIAGNOSIS — N182 Chronic kidney disease, stage 2 (mild): Secondary | ICD-10-CM | POA: Diagnosis not present

## 2020-04-06 DIAGNOSIS — I129 Hypertensive chronic kidney disease with stage 1 through stage 4 chronic kidney disease, or unspecified chronic kidney disease: Secondary | ICD-10-CM | POA: Diagnosis not present

## 2020-04-06 DIAGNOSIS — E1122 Type 2 diabetes mellitus with diabetic chronic kidney disease: Secondary | ICD-10-CM | POA: Diagnosis not present

## 2020-05-06 DIAGNOSIS — E7849 Other hyperlipidemia: Secondary | ICD-10-CM | POA: Diagnosis not present

## 2020-05-06 DIAGNOSIS — E1122 Type 2 diabetes mellitus with diabetic chronic kidney disease: Secondary | ICD-10-CM | POA: Diagnosis not present

## 2020-05-06 DIAGNOSIS — I129 Hypertensive chronic kidney disease with stage 1 through stage 4 chronic kidney disease, or unspecified chronic kidney disease: Secondary | ICD-10-CM | POA: Diagnosis not present

## 2020-05-06 DIAGNOSIS — N182 Chronic kidney disease, stage 2 (mild): Secondary | ICD-10-CM | POA: Diagnosis not present

## 2020-06-07 DIAGNOSIS — E785 Hyperlipidemia, unspecified: Secondary | ICD-10-CM | POA: Diagnosis not present

## 2020-06-07 DIAGNOSIS — E1122 Type 2 diabetes mellitus with diabetic chronic kidney disease: Secondary | ICD-10-CM | POA: Diagnosis not present

## 2020-06-07 DIAGNOSIS — E7849 Other hyperlipidemia: Secondary | ICD-10-CM | POA: Diagnosis not present

## 2020-06-07 DIAGNOSIS — E1165 Type 2 diabetes mellitus with hyperglycemia: Secondary | ICD-10-CM | POA: Diagnosis not present

## 2020-06-07 DIAGNOSIS — N182 Chronic kidney disease, stage 2 (mild): Secondary | ICD-10-CM | POA: Diagnosis not present

## 2020-06-07 DIAGNOSIS — I129 Hypertensive chronic kidney disease with stage 1 through stage 4 chronic kidney disease, or unspecified chronic kidney disease: Secondary | ICD-10-CM | POA: Diagnosis not present

## 2020-06-07 DIAGNOSIS — Z1389 Encounter for screening for other disorder: Secondary | ICD-10-CM | POA: Diagnosis not present

## 2020-06-07 DIAGNOSIS — R945 Abnormal results of liver function studies: Secondary | ICD-10-CM | POA: Diagnosis not present

## 2020-06-20 DIAGNOSIS — E119 Type 2 diabetes mellitus without complications: Secondary | ICD-10-CM | POA: Diagnosis not present

## 2020-07-07 DIAGNOSIS — E1122 Type 2 diabetes mellitus with diabetic chronic kidney disease: Secondary | ICD-10-CM | POA: Diagnosis not present

## 2020-07-07 DIAGNOSIS — E7849 Other hyperlipidemia: Secondary | ICD-10-CM | POA: Diagnosis not present

## 2020-07-07 DIAGNOSIS — N182 Chronic kidney disease, stage 2 (mild): Secondary | ICD-10-CM | POA: Diagnosis not present

## 2020-07-07 DIAGNOSIS — I129 Hypertensive chronic kidney disease with stage 1 through stage 4 chronic kidney disease, or unspecified chronic kidney disease: Secondary | ICD-10-CM | POA: Diagnosis not present

## 2020-08-06 DIAGNOSIS — E1122 Type 2 diabetes mellitus with diabetic chronic kidney disease: Secondary | ICD-10-CM | POA: Diagnosis not present

## 2020-08-06 DIAGNOSIS — I129 Hypertensive chronic kidney disease with stage 1 through stage 4 chronic kidney disease, or unspecified chronic kidney disease: Secondary | ICD-10-CM | POA: Diagnosis not present

## 2020-08-06 DIAGNOSIS — N182 Chronic kidney disease, stage 2 (mild): Secondary | ICD-10-CM | POA: Diagnosis not present

## 2020-08-06 DIAGNOSIS — E7849 Other hyperlipidemia: Secondary | ICD-10-CM | POA: Diagnosis not present

## 2020-09-06 DIAGNOSIS — E1122 Type 2 diabetes mellitus with diabetic chronic kidney disease: Secondary | ICD-10-CM | POA: Diagnosis not present

## 2020-09-06 DIAGNOSIS — I129 Hypertensive chronic kidney disease with stage 1 through stage 4 chronic kidney disease, or unspecified chronic kidney disease: Secondary | ICD-10-CM | POA: Diagnosis not present

## 2020-09-06 DIAGNOSIS — E7849 Other hyperlipidemia: Secondary | ICD-10-CM | POA: Diagnosis not present

## 2020-09-06 DIAGNOSIS — N182 Chronic kidney disease, stage 2 (mild): Secondary | ICD-10-CM | POA: Diagnosis not present

## 2020-11-25 DIAGNOSIS — Z0001 Encounter for general adult medical examination with abnormal findings: Secondary | ICD-10-CM | POA: Diagnosis not present

## 2020-11-25 DIAGNOSIS — Z1389 Encounter for screening for other disorder: Secondary | ICD-10-CM | POA: Diagnosis not present

## 2020-11-25 DIAGNOSIS — R945 Abnormal results of liver function studies: Secondary | ICD-10-CM | POA: Diagnosis not present

## 2020-11-25 DIAGNOSIS — R7309 Other abnormal glucose: Secondary | ICD-10-CM | POA: Diagnosis not present

## 2020-11-25 DIAGNOSIS — C20 Malignant neoplasm of rectum: Secondary | ICD-10-CM | POA: Diagnosis not present

## 2020-11-25 DIAGNOSIS — E783 Hyperchylomicronemia: Secondary | ICD-10-CM | POA: Diagnosis not present

## 2021-01-04 DIAGNOSIS — N182 Chronic kidney disease, stage 2 (mild): Secondary | ICD-10-CM | POA: Diagnosis not present

## 2021-01-04 DIAGNOSIS — E1122 Type 2 diabetes mellitus with diabetic chronic kidney disease: Secondary | ICD-10-CM | POA: Diagnosis not present

## 2021-01-04 DIAGNOSIS — E7849 Other hyperlipidemia: Secondary | ICD-10-CM | POA: Diagnosis not present

## 2021-03-07 DIAGNOSIS — E1122 Type 2 diabetes mellitus with diabetic chronic kidney disease: Secondary | ICD-10-CM | POA: Diagnosis not present

## 2021-03-07 DIAGNOSIS — I129 Hypertensive chronic kidney disease with stage 1 through stage 4 chronic kidney disease, or unspecified chronic kidney disease: Secondary | ICD-10-CM | POA: Diagnosis not present

## 2021-03-07 DIAGNOSIS — N183 Chronic kidney disease, stage 3 unspecified: Secondary | ICD-10-CM | POA: Diagnosis not present

## 2021-03-07 DIAGNOSIS — E7849 Other hyperlipidemia: Secondary | ICD-10-CM | POA: Diagnosis not present

## 2021-03-08 ENCOUNTER — Emergency Department (HOSPITAL_COMMUNITY)
Admission: EM | Admit: 2021-03-08 | Discharge: 2021-03-08 | Disposition: A | Discharge status: Home / Self Care | Payer: Medicare Other | Attending: Emergency Medicine | Admitting: Emergency Medicine

## 2021-03-08 ENCOUNTER — Emergency Department (HOSPITAL_COMMUNITY): Payer: Medicare Other

## 2021-03-08 ENCOUNTER — Other Ambulatory Visit: Payer: Self-pay

## 2021-03-08 ENCOUNTER — Encounter (HOSPITAL_COMMUNITY): Payer: Self-pay | Admitting: Emergency Medicine

## 2021-03-08 DIAGNOSIS — Y92410 Unspecified street and highway as the place of occurrence of the external cause: Secondary | ICD-10-CM | POA: Diagnosis not present

## 2021-03-08 DIAGNOSIS — R079 Chest pain, unspecified: Secondary | ICD-10-CM | POA: Diagnosis not present

## 2021-03-08 DIAGNOSIS — E872 Acidosis: Secondary | ICD-10-CM | POA: Insufficient documentation

## 2021-03-08 DIAGNOSIS — Z79899 Other long term (current) drug therapy: Secondary | ICD-10-CM | POA: Insufficient documentation

## 2021-03-08 DIAGNOSIS — Z20822 Contact with and (suspected) exposure to covid-19: Secondary | ICD-10-CM | POA: Insufficient documentation

## 2021-03-08 DIAGNOSIS — I10 Essential (primary) hypertension: Secondary | ICD-10-CM | POA: Insufficient documentation

## 2021-03-08 DIAGNOSIS — R0789 Other chest pain: Secondary | ICD-10-CM | POA: Diagnosis not present

## 2021-03-08 DIAGNOSIS — M79643 Pain in unspecified hand: Secondary | ICD-10-CM | POA: Diagnosis not present

## 2021-03-08 DIAGNOSIS — Z85038 Personal history of other malignant neoplasm of large intestine: Secondary | ICD-10-CM | POA: Insufficient documentation

## 2021-03-08 DIAGNOSIS — S62231A Other displaced fracture of base of first metacarpal bone, right hand, initial encounter for closed fracture: Secondary | ICD-10-CM | POA: Diagnosis not present

## 2021-03-08 DIAGNOSIS — S60922A Unspecified superficial injury of left hand, initial encounter: Secondary | ICD-10-CM | POA: Diagnosis present

## 2021-03-08 DIAGNOSIS — S0990XA Unspecified injury of head, initial encounter: Secondary | ICD-10-CM | POA: Insufficient documentation

## 2021-03-08 DIAGNOSIS — R001 Bradycardia, unspecified: Secondary | ICD-10-CM | POA: Diagnosis not present

## 2021-03-08 DIAGNOSIS — R000 Tachycardia, unspecified: Secondary | ICD-10-CM | POA: Diagnosis not present

## 2021-03-08 DIAGNOSIS — S6292XA Unspecified fracture of left wrist and hand, initial encounter for closed fracture: Secondary | ICD-10-CM | POA: Diagnosis not present

## 2021-03-08 DIAGNOSIS — R609 Edema, unspecified: Secondary | ICD-10-CM | POA: Diagnosis not present

## 2021-03-08 LAB — URINALYSIS, ROUTINE W REFLEX MICROSCOPIC
Bacteria, UA: NONE SEEN
Bilirubin Urine: NEGATIVE
Glucose, UA: NEGATIVE mg/dL
Ketones, ur: NEGATIVE mg/dL
Leukocytes,Ua: NEGATIVE
Nitrite: NEGATIVE
Protein, ur: NEGATIVE mg/dL
Specific Gravity, Urine: 1.038 — ABNORMAL HIGH (ref 1.005–1.030)
pH: 5 (ref 5.0–8.0)

## 2021-03-08 LAB — COMPREHENSIVE METABOLIC PANEL
ALT: 43 U/L (ref 0–44)
AST: 63 U/L — ABNORMAL HIGH (ref 15–41)
Albumin: 4.2 g/dL (ref 3.5–5.0)
Alkaline Phosphatase: 44 U/L (ref 38–126)
Anion gap: 11 (ref 5–15)
BUN: 14 mg/dL (ref 8–23)
CO2: 23 mmol/L (ref 22–32)
Calcium: 9.5 mg/dL (ref 8.9–10.3)
Chloride: 104 mmol/L (ref 98–111)
Creatinine, Ser: 1.45 mg/dL — ABNORMAL HIGH (ref 0.61–1.24)
GFR, Estimated: 51 mL/min — ABNORMAL LOW (ref >60)
Glucose, Bld: 128 mg/dL — ABNORMAL HIGH (ref 70–99)
Potassium: 4.9 mmol/L (ref 3.5–5.1)
Sodium: 138 mmol/L (ref 135–145)
Total Bilirubin: 0.9 mg/dL (ref 0.3–1.2)
Total Protein: 7.4 g/dL (ref 6.5–8.1)

## 2021-03-08 LAB — CBC
HCT: 41.8 % (ref 39.0–52.0)
Hemoglobin: 13.6 g/dL (ref 13.0–17.0)
MCH: 31.1 pg (ref 26.0–34.0)
MCHC: 32.5 g/dL (ref 30.0–36.0)
MCV: 95.7 fL (ref 80.0–100.0)
Platelets: DECREASED 10*3/uL (ref 150–400)
RBC: 4.37 MIL/uL (ref 4.22–5.81)
RDW: 13.1 % (ref 11.5–15.5)
WBC: 8.9 10*3/uL (ref 4.0–10.5)
nRBC: 0 % (ref 0.0–0.2)

## 2021-03-08 LAB — SAMPLE TO BLOOD BANK

## 2021-03-08 LAB — PROTIME-INR
INR: 1.2 (ref 0.8–1.2)
Prothrombin Time: 15.6 seconds — ABNORMAL HIGH (ref 11.4–15.2)

## 2021-03-08 LAB — RESP PANEL BY RT-PCR (FLU A&B, COVID) ARPGX2
Influenza A by PCR: NEGATIVE
Influenza B by PCR: NEGATIVE
SARS Coronavirus 2 by RT PCR: NEGATIVE

## 2021-03-08 LAB — LACTIC ACID, PLASMA: Lactic Acid, Venous: 3.2 mmol/L (ref 0.5–1.9)

## 2021-03-08 LAB — ETHANOL: Alcohol, Ethyl (B): <10 mg/dL (ref <10)

## 2021-03-08 LAB — I-STAT CHEM 8, ED: Creatinine, Ser: 1.4 mg/dL — ABNORMAL HIGH (ref 0.61–1.24)

## 2021-03-08 MED ORDER — HYDROCODONE-ACETAMINOPHEN 5-325 MG PO TABS: 1.0000 | ORAL_TABLET | Freq: Four times a day (QID) | ORAL | 0 refills | Status: DC | PRN

## 2021-03-08 MED ORDER — IOHEXOL 300 MG/ML  SOLN
100.0000 mL | Freq: Once | INTRAMUSCULAR | Status: AC | PRN
  Administered 2021-03-08: 100 mL via INTRAVENOUS
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
## 2021-03-08 NOTE — ED Notes (Signed)
Patient transported to CT 
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
## 2021-03-08 NOTE — ED Notes (Signed)
Provided pt with urinal and informed pt that we need a urinalysis when possible.

## 2021-03-08 NOTE — Progress Notes (Signed)
Orthopedic Tech Progress Note Patient Details:  Jimmy Ayala Nov 18, 1945 [id number]  Ortho Devices Type of Ortho Device: Thumb velcro splint Ortho Device/Splint Location: LUE Ortho Device/Splint Interventions: Ordered,Application,Adjustment   Post Interventions Patient Tolerated: Well Instructions Provided: Care of [place] 03/08/2021, 1:09 PM

## 2021-03-09 LAB — I-STAT CHEM 8, ED
BUN: 21 mg/dL (ref 8–23)
Calcium, Ion: 1.02 mmol/L — ABNORMAL LOW (ref 1.15–1.40)
Chloride: 108 mmol/L (ref 98–111)
Glucose, Bld: 130 mg/dL — ABNORMAL HIGH (ref 70–99)
HCT: 40 % (ref 39.0–52.0)
Hemoglobin: 13.6 g/dL (ref 13.0–17.0)
Potassium: 7.1 mmol/L (ref 3.5–5.1)
Sodium: 136 mmol/L (ref 135–145)
TCO2: 24 mmol/L (ref 22–32)

## 2021-03-13 DIAGNOSIS — S20213S Contusion of bilateral front wall of thorax, sequela: Secondary | ICD-10-CM | POA: Diagnosis not present

## 2021-03-13 DIAGNOSIS — S6292XS Unspecified fracture of left wrist and hand, sequela: Secondary | ICD-10-CM | POA: Diagnosis not present

## 2021-03-14 DIAGNOSIS — S62232A Other displaced fracture of base of first metacarpal bone, left hand, initial encounter for closed fracture: Secondary | ICD-10-CM | POA: Diagnosis not present

## 2021-03-20 DIAGNOSIS — S62212B Bennett's fracture, left hand, initial encounter for open fracture: Secondary | ICD-10-CM | POA: Diagnosis not present

## 2021-03-20 DIAGNOSIS — S62232A Other displaced fracture of base of first metacarpal bone, left hand, initial encounter for closed fracture: Secondary | ICD-10-CM | POA: Diagnosis not present

## 2021-03-20 DIAGNOSIS — G8918 Other acute postprocedural pain: Secondary | ICD-10-CM | POA: Diagnosis not present

## 2021-03-31 DIAGNOSIS — S62232D Other displaced fracture of base of first metacarpal bone, left hand, subsequent encounter for fracture with routine healing: Secondary | ICD-10-CM | POA: Diagnosis not present

## 2021-03-31 DIAGNOSIS — M79645 Pain in left finger(s): Secondary | ICD-10-CM | POA: Diagnosis not present

## 2021-04-12 DIAGNOSIS — M79645 Pain in left finger(s): Secondary | ICD-10-CM | POA: Diagnosis not present

## 2021-04-19 DIAGNOSIS — M79645 Pain in left finger(s): Secondary | ICD-10-CM | POA: Diagnosis not present

## 2021-04-24 DIAGNOSIS — M79645 Pain in left finger(s): Secondary | ICD-10-CM | POA: Diagnosis not present

## 2021-05-03 DIAGNOSIS — M79645 Pain in left finger(s): Secondary | ICD-10-CM | POA: Diagnosis not present

## 2021-05-07 DIAGNOSIS — E1122 Type 2 diabetes mellitus with diabetic chronic kidney disease: Secondary | ICD-10-CM | POA: Diagnosis not present

## 2021-05-07 DIAGNOSIS — N183 Chronic kidney disease, stage 3 unspecified: Secondary | ICD-10-CM | POA: Diagnosis not present

## 2021-05-07 DIAGNOSIS — I129 Hypertensive chronic kidney disease with stage 1 through stage 4 chronic kidney disease, or unspecified chronic kidney disease: Secondary | ICD-10-CM | POA: Diagnosis not present

## 2021-05-07 DIAGNOSIS — E782 Mixed hyperlipidemia: Secondary | ICD-10-CM | POA: Diagnosis not present

## 2021-05-09 DIAGNOSIS — S62232A Other displaced fracture of base of first metacarpal bone, left hand, initial encounter for closed fracture: Secondary | ICD-10-CM | POA: Diagnosis not present

## 2021-05-09 DIAGNOSIS — M79645 Pain in left finger(s): Secondary | ICD-10-CM | POA: Diagnosis not present

## 2021-05-17 DIAGNOSIS — M79645 Pain in left finger(s): Secondary | ICD-10-CM | POA: Diagnosis not present

## 2021-05-24 DIAGNOSIS — M79645 Pain in left finger(s): Secondary | ICD-10-CM | POA: Diagnosis not present

## 2021-05-31 DIAGNOSIS — M79645 Pain in left finger(s): Secondary | ICD-10-CM | POA: Diagnosis not present

## 2021-06-06 DIAGNOSIS — S62232A Other displaced fracture of base of first metacarpal bone, left hand, initial encounter for closed fracture: Secondary | ICD-10-CM | POA: Diagnosis not present

## 2021-06-06 DIAGNOSIS — M79645 Pain in left finger(s): Secondary | ICD-10-CM | POA: Diagnosis not present

## 2021-06-22 DIAGNOSIS — E119 Type 2 diabetes mellitus without complications: Secondary | ICD-10-CM | POA: Diagnosis not present

## 2021-10-10 DIAGNOSIS — I10 Essential (primary) hypertension: Secondary | ICD-10-CM | POA: Diagnosis not present

## 2021-10-10 DIAGNOSIS — R945 Abnormal results of liver function studies: Secondary | ICD-10-CM | POA: Diagnosis not present

## 2021-10-10 DIAGNOSIS — C20 Malignant neoplasm of rectum: Secondary | ICD-10-CM | POA: Diagnosis not present

## 2021-10-10 DIAGNOSIS — E1165 Type 2 diabetes mellitus with hyperglycemia: Secondary | ICD-10-CM | POA: Diagnosis not present

## 2021-10-10 DIAGNOSIS — E785 Hyperlipidemia, unspecified: Secondary | ICD-10-CM | POA: Diagnosis not present

## 2021-10-19 DIAGNOSIS — S62232A Other displaced fracture of base of first metacarpal bone, left hand, initial encounter for closed fracture: Secondary | ICD-10-CM | POA: Diagnosis not present

## 2022-06-18 DIAGNOSIS — Z0001 Encounter for general adult medical examination with abnormal findings: Secondary | ICD-10-CM | POA: Diagnosis not present

## 2022-06-18 DIAGNOSIS — R945 Abnormal results of liver function studies: Secondary | ICD-10-CM | POA: Diagnosis not present

## 2022-06-18 DIAGNOSIS — E1165 Type 2 diabetes mellitus with hyperglycemia: Secondary | ICD-10-CM | POA: Diagnosis not present

## 2022-06-18 DIAGNOSIS — E785 Hyperlipidemia, unspecified: Secondary | ICD-10-CM | POA: Diagnosis not present

## 2022-06-18 DIAGNOSIS — I10 Essential (primary) hypertension: Secondary | ICD-10-CM | POA: Diagnosis not present

## 2022-06-27 DIAGNOSIS — E119 Type 2 diabetes mellitus without complications: Secondary | ICD-10-CM | POA: Diagnosis not present
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
## 2022-08-27 DIAGNOSIS — J029 Acute pharyngitis, unspecified: Secondary | ICD-10-CM | POA: Diagnosis not present

## 2022-08-27 DIAGNOSIS — J069 Acute upper respiratory infection, unspecified: Secondary | ICD-10-CM | POA: Diagnosis not present

## 2022-12-13 DIAGNOSIS — I10 Essential (primary) hypertension: Secondary | ICD-10-CM | POA: Diagnosis not present

## 2022-12-13 DIAGNOSIS — R945 Abnormal results of liver function studies: Secondary | ICD-10-CM | POA: Diagnosis not present

## 2022-12-13 DIAGNOSIS — E785 Hyperlipidemia, unspecified: Secondary | ICD-10-CM | POA: Diagnosis not present

## 2022-12-13 DIAGNOSIS — E1159 Type 2 diabetes mellitus with other circulatory complications: Secondary | ICD-10-CM | POA: Diagnosis not present

## 2023-01-03 DIAGNOSIS — I10 Essential (primary) hypertension: Secondary | ICD-10-CM | POA: Diagnosis not present

## 2023-02-06 DIAGNOSIS — M5126 Other intervertebral disc displacement, lumbar region: Secondary | ICD-10-CM | POA: Diagnosis not present

## 2023-02-18 ENCOUNTER — Other Ambulatory Visit (HOSPITAL_COMMUNITY): Payer: Self-pay | Admitting: Internal Medicine

## 2023-02-18 ENCOUNTER — Ambulatory Visit (HOSPITAL_COMMUNITY)
Admission: RE | Admit: 2023-02-18 | Discharge: 2023-02-18 | Disposition: A | Discharge status: Home / Self Care | Payer: Medicare Other | Source: Ambulatory Visit | Attending: Internal Medicine | Admitting: Internal Medicine

## 2023-02-18 DIAGNOSIS — M545 Low back pain, unspecified: Secondary | ICD-10-CM

## 2023-02-18 DIAGNOSIS — C20 Malignant neoplasm of rectum: Secondary | ICD-10-CM | POA: Diagnosis not present

## 2023-02-18 DIAGNOSIS — I10 Essential (primary) hypertension: Secondary | ICD-10-CM | POA: Diagnosis not present

## 2023-02-18 DIAGNOSIS — E1165 Type 2 diabetes mellitus with hyperglycemia: Secondary | ICD-10-CM | POA: Diagnosis not present

## 2023-04-15 DIAGNOSIS — M25512 Pain in left shoulder: Secondary | ICD-10-CM | POA: Diagnosis not present

## 2023-06-24 DIAGNOSIS — Z0001 Encounter for general adult medical examination with abnormal findings: Secondary | ICD-10-CM | POA: Diagnosis not present

## 2023-06-24 DIAGNOSIS — C20 Malignant neoplasm of rectum: Secondary | ICD-10-CM | POA: Diagnosis not present

## 2023-06-24 DIAGNOSIS — R945 Abnormal results of liver function studies: Secondary | ICD-10-CM | POA: Diagnosis not present

## 2023-06-24 DIAGNOSIS — E785 Hyperlipidemia, unspecified: Secondary | ICD-10-CM | POA: Diagnosis not present

## 2023-06-24 DIAGNOSIS — E1165 Type 2 diabetes mellitus with hyperglycemia: Secondary | ICD-10-CM | POA: Diagnosis not present

## 2023-06-24 DIAGNOSIS — I10 Essential (primary) hypertension: Secondary | ICD-10-CM | POA: Diagnosis not present

## 2023-06-26 ENCOUNTER — Encounter: Payer: Self-pay | Admitting: *Deleted

## 2023-08-05 ENCOUNTER — Other Ambulatory Visit: Payer: Self-pay | Admitting: *Deleted

## 2023-08-05 DIAGNOSIS — Z1211 Encounter for screening for malignant neoplasm of colon: Secondary | ICD-10-CM

## 2023-08-06 ENCOUNTER — Ambulatory Visit: Payer: Medicare Other | Admitting: General Surgery

## 2023-08-06 ENCOUNTER — Encounter: Payer: Self-pay | Admitting: General Surgery

## 2023-08-06 VITALS — BP 152/79 | HR 67 | Temp 97.8°F | Resp 14 | Ht 69.0 in | Wt 203.0 lb

## 2023-08-06 DIAGNOSIS — Z85038 Personal history of other malignant neoplasm of large intestine: Secondary | ICD-10-CM | POA: Diagnosis not present

## 2023-08-06 DIAGNOSIS — Z1211 Encounter for screening for malignant neoplasm of colon: Secondary | ICD-10-CM

## 2023-08-06 MED ORDER — SUTAB 1479-225-188 MG PO TABS: 24.0000 | ORAL_TABLET | Freq: Once | ORAL | 0 refills | Status: AC

## 2023-08-06 NOTE — Addendum Note (Signed)
Addended by: Phillips Odor on: 08/06/2023 09:49 AM   Modules accepted: Orders

## 2023-08-06 NOTE — Progress Notes (Signed)
Jimmy Ayala; [id number]; [birth year]   HPI Patient is a 77 year old white male who was referred to my care by Dr. Sherwood Gambler for evaluation and treatment for a screening colonoscopy.  Patient last had a colonoscopy in 2013.  He states he has multiple family members on his father side who had colon cancer.  He denies any blood per rectum, abnormal diarrhea or constipation, or significant weight change. Past Medical History:  Diagnosis Date   Cancer [facility])    Colon Cancer   Gout    Hyperlipidemia    Hypertension    Renal insufficiency 03/31/14   1.43   Transaminasemia     Past Surgical History:  Procedure Laterality Date   APPENDECTOMY     COLON RESECTION     COLONOSCOPY  06/03/2012   Procedure: COLONOSCOPY;  Surgeon: Dalia Heading, MD;  Location: [facility];  Service: Gastroenterology;  Laterality: N/A;   Right Rotator Cuff Repair      Family History  Problem Relation Age of Onset   Colon cancer Father     Current Outpatient Medications on File Prior to Visit  Medication Sig Dispense Refill   amLODipine (NORVASC) 5 MG tablet Take 5 mg by mouth daily.     fenofibrate 160 MG tablet Take 160 mg by mouth daily.     lisinopril (ZESTRIL) 10 MG tablet Take 10 mg by mouth daily.     metFORMIN (GLUCOPHAGE) 1000 MG tablet Take 1,000 mg by mouth in the morning and at bedtime.     simvastatin (ZOCOR) 40 MG tablet Take 40 mg by mouth every evening.     No current facility-administered medications on file prior to visit.    Allergies  Allergen Reactions   Lipitor [Atorvastatin]     Makes patient hurt all over.   Prednisone Swelling    Social History   Substance and Sexual Activity  Alcohol Use Yes   Comment: (approx 3 beers a day)    Social History   Tobacco Use  Smoking Status Never  Smokeless Tobacco Not on file    Review of Systems  Constitutional: Negative.   HENT: Negative.    Eyes: Negative.   Respiratory: Negative.    Cardiovascular: Negative.    Gastrointestinal: Negative.   Genitourinary: Negative.   Musculoskeletal: Negative.   Skin: Negative.   Neurological: Negative.   Endo/Heme/Allergies: Negative.   Psychiatric/Behavioral: Negative.      Objective   Vitals:   08/06/23 0849  BP: (!) 152/79  Pulse: 67  Resp: 14  Temp: 97.8 F (36.6 C)  SpO2: 95%    Physical Exam Vitals reviewed.  Constitutional:      Appearance: Normal appearance. He is normal weight. He is not ill-appearing.  HENT:     Head: Normocephalic and atraumatic.  Cardiovascular:     Rate and Rhythm: Normal rate and regular rhythm.     Heart sounds: Normal heart sounds. No murmur heard.    No friction rub. No gallop.  Pulmonary:     Effort: Pulmonary effort is normal. No respiratory distress.     Breath sounds: Normal breath sounds. No stridor. No wheezing, rhonchi or rales.  Abdominal:     General: Bowel sounds are normal. There is no distension.     Palpations: Abdomen is soft. There is no mass.     Tenderness: There is no abdominal tenderness. There is no guarding.     Hernia: No hernia is present.  Genitourinary:    Testes: Normal.  Skin:  General: Skin is warm and dry.  Neurological:     Mental Status: He is alert and oriented to person, place, and time.    Primary care notes reviewed Assessment  Need for screening colonoscopy, family history of colon cancer Plan  Patient is scheduled for screening colonoscopy on 08/27/2023.  The risks and benefits of the procedure including bleeding and perforation were fully explained to the patient, who gave informed consent.  Sutabs have been prescribed for bowel preparation.

## 2023-08-07 NOTE — H&P (Signed)
Jimmy Ayala; [id number]; [birth year]   HPI Patient is a 77 year old white male who was referred to my care by Dr. Sherwood Gambler for evaluation and treatment for a screening colonoscopy.  Patient last had a colonoscopy in 2013.  He states he has multiple family members on his father side who had colon cancer.  He denies any blood per rectum, abnormal diarrhea or constipation, or significant weight change. Past Medical History:  Diagnosis Date   Cancer [facility])    Colon Cancer   Gout    Hyperlipidemia    Hypertension    Renal insufficiency 03/31/14   1.43   Transaminasemia     Past Surgical History:  Procedure Laterality Date   APPENDECTOMY     COLON RESECTION     COLONOSCOPY  06/03/2012   Procedure: COLONOSCOPY;  Surgeon: Dalia Heading, MD;  Location: [facility];  Service: Gastroenterology;  Laterality: N/A;   Right Rotator Cuff Repair      Family History  Problem Relation Age of Onset   Colon cancer Father     Current Outpatient Medications on File Prior to Visit  Medication Sig Dispense Refill   amLODipine (NORVASC) 5 MG tablet Take 5 mg by mouth daily.     fenofibrate 160 MG tablet Take 160 mg by mouth daily.     lisinopril (ZESTRIL) 10 MG tablet Take 10 mg by mouth daily.     metFORMIN (GLUCOPHAGE) 1000 MG tablet Take 1,000 mg by mouth in the morning and at bedtime.     simvastatin (ZOCOR) 40 MG tablet Take 40 mg by mouth every evening.     No current facility-administered medications on file prior to visit.    Allergies  Allergen Reactions   Lipitor [Atorvastatin]     Makes patient hurt all over.   Prednisone Swelling    Social History   Substance and Sexual Activity  Alcohol Use Yes   Comment: (approx 3 beers a day)    Social History   Tobacco Use  Smoking Status Never  Smokeless Tobacco Not on file    Review of Systems  Constitutional: Negative.   HENT: Negative.    Eyes: Negative.   Respiratory: Negative.    Cardiovascular: Negative.    Gastrointestinal: Negative.   Genitourinary: Negative.   Musculoskeletal: Negative.   Skin: Negative.   Neurological: Negative.   Endo/Heme/Allergies: Negative.   Psychiatric/Behavioral: Negative.      Objective   Vitals:   08/06/23 0849  BP: (!) 152/79  Pulse: 67  Resp: 14  Temp: 97.8 F (36.6 C)  SpO2: 95%    Physical Exam Vitals reviewed.  Constitutional:      Appearance: Normal appearance. He is normal weight. He is not ill-appearing.  HENT:     Head: Normocephalic and atraumatic.  Cardiovascular:     Rate and Rhythm: Normal rate and regular rhythm.     Heart sounds: Normal heart sounds. No murmur heard.    No friction rub. No gallop.  Pulmonary:     Effort: Pulmonary effort is normal. No respiratory distress.     Breath sounds: Normal breath sounds. No stridor. No wheezing, rhonchi or rales.  Abdominal:     General: Bowel sounds are normal. There is no distension.     Palpations: Abdomen is soft. There is no mass.     Tenderness: There is no abdominal tenderness. There is no guarding.     Hernia: No hernia is present.  Genitourinary:    Testes: Normal.  Skin:  General: Skin is warm and dry.  Neurological:     Mental Status: He is alert and oriented to person, place, and time.    Primary care notes reviewed Assessment  Need for screening colonoscopy, family history of colon cancer Plan  Patient is scheduled for screening colonoscopy on 08/27/2023.  The risks and benefits of the procedure including bleeding and perforation were fully explained to the patient, who gave informed consent.  Sutabs have been prescribed for bowel preparation.

## 2023-08-19 DIAGNOSIS — E119 Type 2 diabetes mellitus without complications: Secondary | ICD-10-CM | POA: Diagnosis not present

## 2023-08-27 ENCOUNTER — Encounter (HOSPITAL_COMMUNITY): Payer: Self-pay | Admitting: General Surgery

## 2023-08-27 ENCOUNTER — Other Ambulatory Visit: Payer: Self-pay

## 2023-08-27 ENCOUNTER — Encounter (HOSPITAL_COMMUNITY)
Admission: RE | Disposition: A | Discharge status: Home / Self Care | Payer: Self-pay | Source: Home / Self Care | Attending: General Surgery

## 2023-08-27 ENCOUNTER — Ambulatory Visit (HOSPITAL_COMMUNITY)
Admission: RE | Admit: 2023-08-27 | Discharge: 2023-08-27 | Disposition: A | Discharge status: Home / Self Care | Payer: Medicare Other | Attending: General Surgery | Admitting: General Surgery

## 2023-08-27 ENCOUNTER — Ambulatory Visit (HOSPITAL_COMMUNITY): Payer: Medicare Other | Admitting: Certified Registered Nurse Anesthetist

## 2023-08-27 DIAGNOSIS — I10 Essential (primary) hypertension: Secondary | ICD-10-CM | POA: Diagnosis not present

## 2023-08-27 DIAGNOSIS — Z1211 Encounter for screening for malignant neoplasm of colon: Secondary | ICD-10-CM | POA: Diagnosis not present

## 2023-08-27 DIAGNOSIS — Z85038 Personal history of other malignant neoplasm of large intestine: Secondary | ICD-10-CM

## 2023-08-27 DIAGNOSIS — Z800 Family history of malignant neoplasm of digestive organs: Secondary | ICD-10-CM | POA: Diagnosis not present

## 2023-08-27 HISTORY — PX: COLONOSCOPY WITH PROPOFOL: SHX5780

## 2023-08-27 LAB — GLUCOSE, CAPILLARY: Glucose-Capillary: 117 mg/dL — ABNORMAL HIGH (ref 70–99)

## 2023-08-27 SURGERY — COLONOSCOPY WITH PROPOFOL
Anesthesia: General

## 2023-08-27 MED ORDER — LACTATED RINGERS IV SOLN: INTRAVENOUS | Status: DC | PRN

## 2023-08-27 MED ORDER — PROPOFOL 10 MG/ML IV BOLUS
INTRAVENOUS | Status: DC | PRN
  Administered 2023-08-27: 50 mg via INTRAVENOUS
  Administered 2023-08-27: 20 mg via INTRAVENOUS
  Administered 2023-08-27: 50 mg via INTRAVENOUS
  Administered 2023-08-27: 100 mg via INTRAVENOUS

## 2023-08-27 MED ORDER — SODIUM CHLORIDE 0.9% FLUSH: 10.0000 mL | Freq: Two times a day (BID) | INTRAVENOUS | Status: DC

## 2023-08-27 NOTE — Op Note (Signed)
[facility] Patient Name: Jimmy Ayala Procedure Date: 08/27/2023 7:15 AM MRN: [id number] Date of Birth: 10-26-1945 Attending MD: Franky Macho , MD, [id number] CSN: [id number] Age: 77 Admit Type: Outpatient Procedure:                Colonoscopy Indications:              High risk colon cancer surveillance: Personal                            history of colon cancer Providers:                Franky Macho, MD, Buel Ream. Thomasena Edis RN, RN, Elinor Parkinson Referring MD:              Medicines:                Propofol per Anesthesia Complications:            No immediate complications. Estimated Blood Loss:     Estimated blood loss: none. Procedure:                Pre-Anesthesia Assessment:                           - Prior to the procedure, a History and Physical                            was performed, and patient medications and                            allergies were reviewed. The patient is competent.                            The risks and benefits of the procedure and the                            sedation options and risks were discussed with the                            patient. All questions were answered and informed                            consent was obtained. Patient identification and                            proposed procedure were verified by the physician,                            the nurse, the anesthesiologist, the anesthetist                            and the technician in the endoscopy suite. Mental  Status Examination: alert and oriented. Airway                            Examination: normal oropharyngeal airway and neck                            mobility. Respiratory Examination: clear to                            auscultation. CV Examination: RRR, no murmurs, no                            S3 or S4. Prophylactic Antibiotics: The patient                            does not require  prophylactic antibiotics. Prior                            Anticoagulants: The patient has taken no                            anticoagulant or antiplatelet agents. ASA Grade                            Assessment: II - A patient with mild systemic                            disease. After reviewing the risks and benefits,                            the patient was deemed in satisfactory condition to                            undergo the procedure. The anesthesia plan was to                            use deep sedation / analgesia. Immediately prior to                            administration of medications, the patient was                            re-assessed for adequacy to receive sedatives. The                            heart rate, respiratory rate, oxygen saturations,                            blood pressure, adequacy of pulmonary ventilation,                            and response to care were monitored throughout the  procedure. The physical status of the patient was                            re-assessed after the procedure.                           After obtaining informed consent, the colonoscope                            was passed under direct vision. Throughout the                            procedure, the patient's blood pressure, pulse, and                            oxygen saturations were monitored continuously. The                            [id number]) scope was introduced through the                            anus and advanced to the the ileocolonic                            anastomosis. No anatomical landmarks were                            photographed. The entire colon was examined. The                            colonoscopy was performed without difficulty. The                            quality of the bowel preparation was adequate. Scope In: 7:18:39 AM Scope Out: 7:25:32 AM Total Procedure Duration: 0 hours 6 minutes 53  seconds  Findings:      The perianal and digital rectal examinations were normal. Left sided       ileocolic anastomosis widely patent. No evidence of recurrent cancer.      The entire examined colon appeared normal on direct and retroflexion       views. Impression:               - The entire examined colon is normal on direct and                            retroflexion views.                           - No specimens collected. Moderate Sedation:      Moderate (conscious) sedation was administered by the nurse and       supervised by the endoscopist. The patient's oxygen saturation, heart       rate, blood pressure and response to care were monitored. Recommendation:           - Written discharge instructions were provided to  the patient.                           - The signs and symptoms of potential delayed                            complications were discussed with the patient.                           - Patient has a contact number available for                            emergencies.                           - Return to normal activities tomorrow.                           - Resume previous diet.                           - Continue present medications.                           - No repeat colonoscopy due to age. Procedure Code(s):        --- Professional ---                           [phone], Colonoscopy, flexible; diagnostic, including                            collection of specimen(s) by brushing or washing,                            when performed (separate procedure) Diagnosis Code(s):        --- Professional ---                           G18.299, Personal history of other malignant                            neoplasm of large intestine CPT copyright 2022 [employer]. All rights reserved. The codes documented in this report are preliminary and upon coder review may  be revised to meet current compliance requirements. Franky Macho, MD Franky Macho, MD 08/27/2023 7:30:43 AM This report has been signed electronically. Number of Addenda: 0

## 2023-08-27 NOTE — Transfer of Care (Signed)
Immediate Anesthesia Transfer of Care Note  Patient: Jimmy Ayala  Procedure(s) Performed: COLONOSCOPY WITH PROPOFOL  Patient Location: Endoscopy Unit  Anesthesia Type:General  Level of Consciousness: awake, alert , and oriented  Airway & Oxygen Therapy: Patient Spontanous Breathing  Post-op Assessment: Report given to RN and Post -op Vital signs reviewed and stable  Post vital signs: Reviewed and stable  Last Vitals:  Vitals Value Taken Time  BP 112/56 08/27/23 0727  Temp 37 C 08/27/23 0727  Pulse 57 08/27/23 0727  Resp 16 08/27/23 0727  SpO2 93 % 08/27/23 0727    Last Pain:  Vitals:   08/27/23 0727  TempSrc: Oral  PainSc: 0-No pain      Patients Stated Pain Goal: Other (Comment) (pt states he doesn't really have any kind of pain) (08/27/23 0640)  Complications: No notable events documented.

## 2023-08-27 NOTE — Anesthesia Preprocedure Evaluation (Signed)
Anesthesia Evaluation  Patient identified by MRN, date of birth, ID band Patient awake    Reviewed: Allergy & Precautions, H&P , NPO status , Patient's Chart, lab work & pertinent test results, reviewed documented beta blocker date and time   Airway Mallampati: II  TM Distance: >3 FB Neck ROM: full    Dental no notable dental hx.    Pulmonary neg pulmonary ROS   Pulmonary exam normal breath sounds clear to auscultation       Cardiovascular Exercise Tolerance: Good hypertension, negative cardio ROS  Rhythm:regular Rate:Normal     Neuro/Psych negative neurological ROS  negative psych ROS   GI/Hepatic negative GI ROS, Neg liver ROS,,,  Endo/Other  negative endocrine ROS    Renal/GU Renal diseasenegative Renal ROS  negative genitourinary   Musculoskeletal   Abdominal   Peds  Hematology negative hematology ROS (+)   Anesthesia Other Findings   Reproductive/Obstetrics negative OB ROS                             Anesthesia Physical Anesthesia Plan  ASA: 2  Anesthesia Plan: General   Post-op Pain Management:    Induction:   PONV Risk Score and Plan: Propofol infusion  Airway Management Planned:   Additional Equipment:   Intra-op Plan:   Post-operative Plan:   Informed Consent: I have reviewed the patients History and Physical, chart, labs and discussed the procedure including the risks, benefits and alternatives for the proposed anesthesia with the patient or authorized representative who has indicated his/her understanding and acceptance.     Dental Advisory Given  Plan Discussed with: CRNA  Anesthesia Plan Comments:        Anesthesia Quick Evaluation

## 2023-08-27 NOTE — Anesthesia Postprocedure Evaluation (Signed)
Anesthesia Post Note  Patient: Jimmy Ayala  Procedure(s) Performed: COLONOSCOPY WITH PROPOFOL  Patient location during evaluation: Phase II Anesthesia Type: General Level of consciousness: awake Pain management: pain level controlled Vital Signs Assessment: post-procedure vital signs reviewed and stable Respiratory status: spontaneous breathing and respiratory function stable Cardiovascular status: blood pressure returned to baseline and stable Postop Assessment: no headache and no apparent nausea or vomiting Anesthetic complications: no Comments: Late entry   No notable events documented.   Last Vitals:  Vitals:   08/27/23 0727 08/27/23 0734  BP: (!) 112/56 113/67  Pulse: (!) 57 62  Resp: 16 18  Temp: 37 C   SpO2: 93% 97%    Last Pain:  Vitals:   08/27/23 0734  TempSrc:   PainSc: 0-No pain                 Windell Norfolk

## 2023-08-27 NOTE — Interval H&P Note (Signed)
History and Physical Interval Note:  08/27/2023 7:12 AM  Jimmy Ayala  has presented today for surgery, with the diagnosis of PERSONAL HISTORY COLON CANCER SCREENING FOR COLON CANCER.  The various methods of treatment have been discussed with the patient and family. After consideration of risks, benefits and other options for treatment, the patient has consented to  Procedure(s): COLONOSCOPY WITH PROPOFOL (N/A) as a surgical intervention.  The patient's history has been reviewed, patient examined, no change in status, stable for surgery.  I have reviewed the patient's chart and labs.  Questions were answered to the patient's satisfaction.     Franky Macho

## 2023-09-02 ENCOUNTER — Encounter (HOSPITAL_COMMUNITY): Payer: Self-pay | Admitting: General Surgery

## 2024-07-07 DIAGNOSIS — D490 Neoplasm of unspecified behavior of digestive system: Secondary | ICD-10-CM | POA: Diagnosis not present

## 2024-07-07 DIAGNOSIS — E785 Hyperlipidemia, unspecified: Secondary | ICD-10-CM | POA: Diagnosis not present

## 2024-07-07 DIAGNOSIS — I10 Essential (primary) hypertension: Secondary | ICD-10-CM | POA: Diagnosis not present

## 2024-07-07 DIAGNOSIS — E119 Type 2 diabetes mellitus without complications: Secondary | ICD-10-CM | POA: Diagnosis not present

## 2024-07-07 DIAGNOSIS — Z0000 Encounter for general adult medical examination without abnormal findings: Secondary | ICD-10-CM | POA: Diagnosis not present
